# Patient Record
Sex: Female | Born: 1965 | Race: White | Hispanic: No | Marital: Married | Smoking: Never smoker
Health system: Southern US, Community
[De-identification: ages and names within clinical notes are randomized; demographics above are authoritative.]

## PROBLEM LIST (undated history)

## (undated) DIAGNOSIS — N951 Menopausal and female climacteric states: Secondary | ICD-10-CM

## (undated) DIAGNOSIS — N946 Dysmenorrhea, unspecified: Secondary | ICD-10-CM

## (undated) DIAGNOSIS — G4700 Insomnia, unspecified: Secondary | ICD-10-CM

## (undated) HISTORY — DX: Menopausal and female climacteric states: N95.1

## (undated) HISTORY — DX: Insomnia, unspecified: G47.00

## (undated) HISTORY — DX: Dysmenorrhea, unspecified: N94.6

---

## 1997-06-17 ENCOUNTER — Inpatient Hospital Stay (HOSPITAL_COMMUNITY): Admission: AD | Admit: 1997-06-17 | Discharge: 1997-06-20 | Payer: Self-pay | Admitting: Obstetrics and Gynecology

## 1997-07-16 ENCOUNTER — Other Ambulatory Visit: Admission: RE | Admit: 1997-07-16 | Discharge: 1997-07-16 | Payer: Self-pay | Admitting: Obstetrics and Gynecology

## 1998-08-09 ENCOUNTER — Other Ambulatory Visit: Admission: RE | Admit: 1998-08-09 | Discharge: 1998-08-09 | Payer: Self-pay | Admitting: Obstetrics and Gynecology

## 1998-10-18 ENCOUNTER — Inpatient Hospital Stay (HOSPITAL_COMMUNITY): Admission: AD | Admit: 1998-10-18 | Discharge: 1998-10-18 | Payer: Self-pay | Admitting: *Deleted

## 1999-12-19 ENCOUNTER — Other Ambulatory Visit: Admission: RE | Admit: 1999-12-19 | Discharge: 1999-12-19 | Payer: Self-pay | Admitting: Obstetrics and Gynecology

## 2001-01-07 ENCOUNTER — Other Ambulatory Visit: Admission: RE | Admit: 2001-01-07 | Discharge: 2001-01-07 | Payer: Self-pay | Admitting: Obstetrics and Gynecology

## 2001-11-25 ENCOUNTER — Ambulatory Visit (HOSPITAL_COMMUNITY): Admission: RE | Admit: 2001-11-25 | Discharge: 2001-11-25 | Payer: Self-pay | Admitting: Obstetrics and Gynecology

## 2001-11-25 ENCOUNTER — Encounter: Payer: Self-pay | Admitting: Obstetrics and Gynecology

## 2002-02-17 ENCOUNTER — Other Ambulatory Visit: Admission: RE | Admit: 2002-02-17 | Discharge: 2002-02-17 | Payer: Self-pay | Admitting: Obstetrics and Gynecology

## 2003-03-20 ENCOUNTER — Other Ambulatory Visit: Admission: RE | Admit: 2003-03-20 | Discharge: 2003-03-20 | Payer: Self-pay | Admitting: Obstetrics and Gynecology

## 2004-08-15 ENCOUNTER — Other Ambulatory Visit: Admission: RE | Admit: 2004-08-15 | Discharge: 2004-08-15 | Payer: Self-pay | Admitting: Obstetrics and Gynecology

## 2005-02-01 ENCOUNTER — Ambulatory Visit: Payer: Self-pay | Admitting: Family Medicine

## 2005-11-23 ENCOUNTER — Ambulatory Visit: Payer: Self-pay | Admitting: Family Medicine

## 2005-11-26 ENCOUNTER — Emergency Department (HOSPITAL_COMMUNITY): Admission: EM | Admit: 2005-11-26 | Discharge: 2005-11-26 | Payer: Self-pay | Admitting: Emergency Medicine

## 2006-04-03 HISTORY — PX: OTHER SURGICAL HISTORY: SHX169

## 2006-06-01 ENCOUNTER — Ambulatory Visit: Payer: Self-pay | Admitting: Family Medicine

## 2006-08-31 ENCOUNTER — Encounter (INDEPENDENT_AMBULATORY_CARE_PROVIDER_SITE_OTHER): Payer: Self-pay | Admitting: Obstetrics and Gynecology

## 2006-08-31 ENCOUNTER — Ambulatory Visit (HOSPITAL_COMMUNITY): Admission: RE | Admit: 2006-08-31 | Discharge: 2006-08-31 | Payer: Self-pay | Admitting: Obstetrics and Gynecology

## 2007-02-03 IMAGING — CR DG CHEST 2V
2 series · 2 of 2 positions shown · non-contrast
Comparison: None

CLINICAL DATA: Fever, cough

CHEST - 2 VIEW:

[w chest pa]
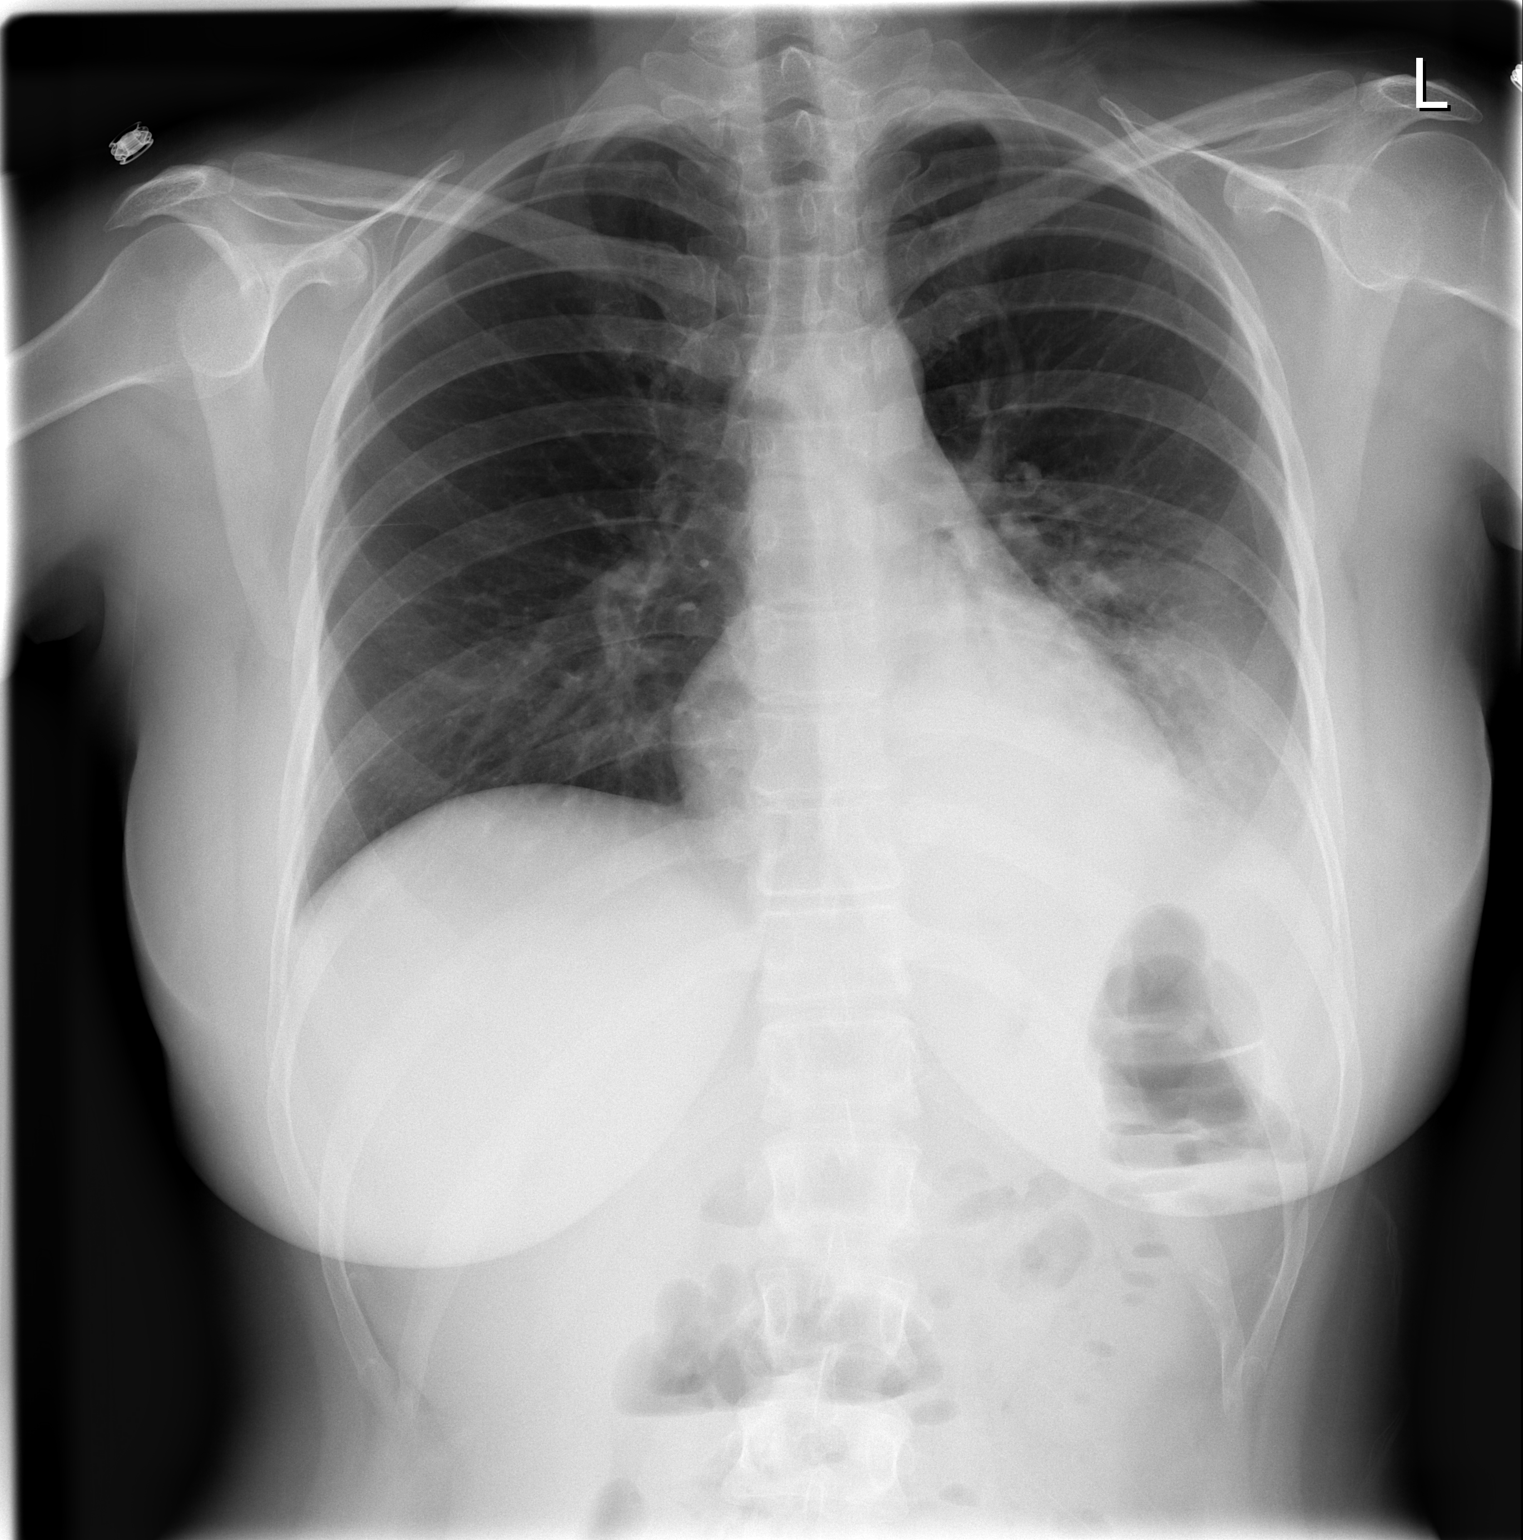

[w chest lat]
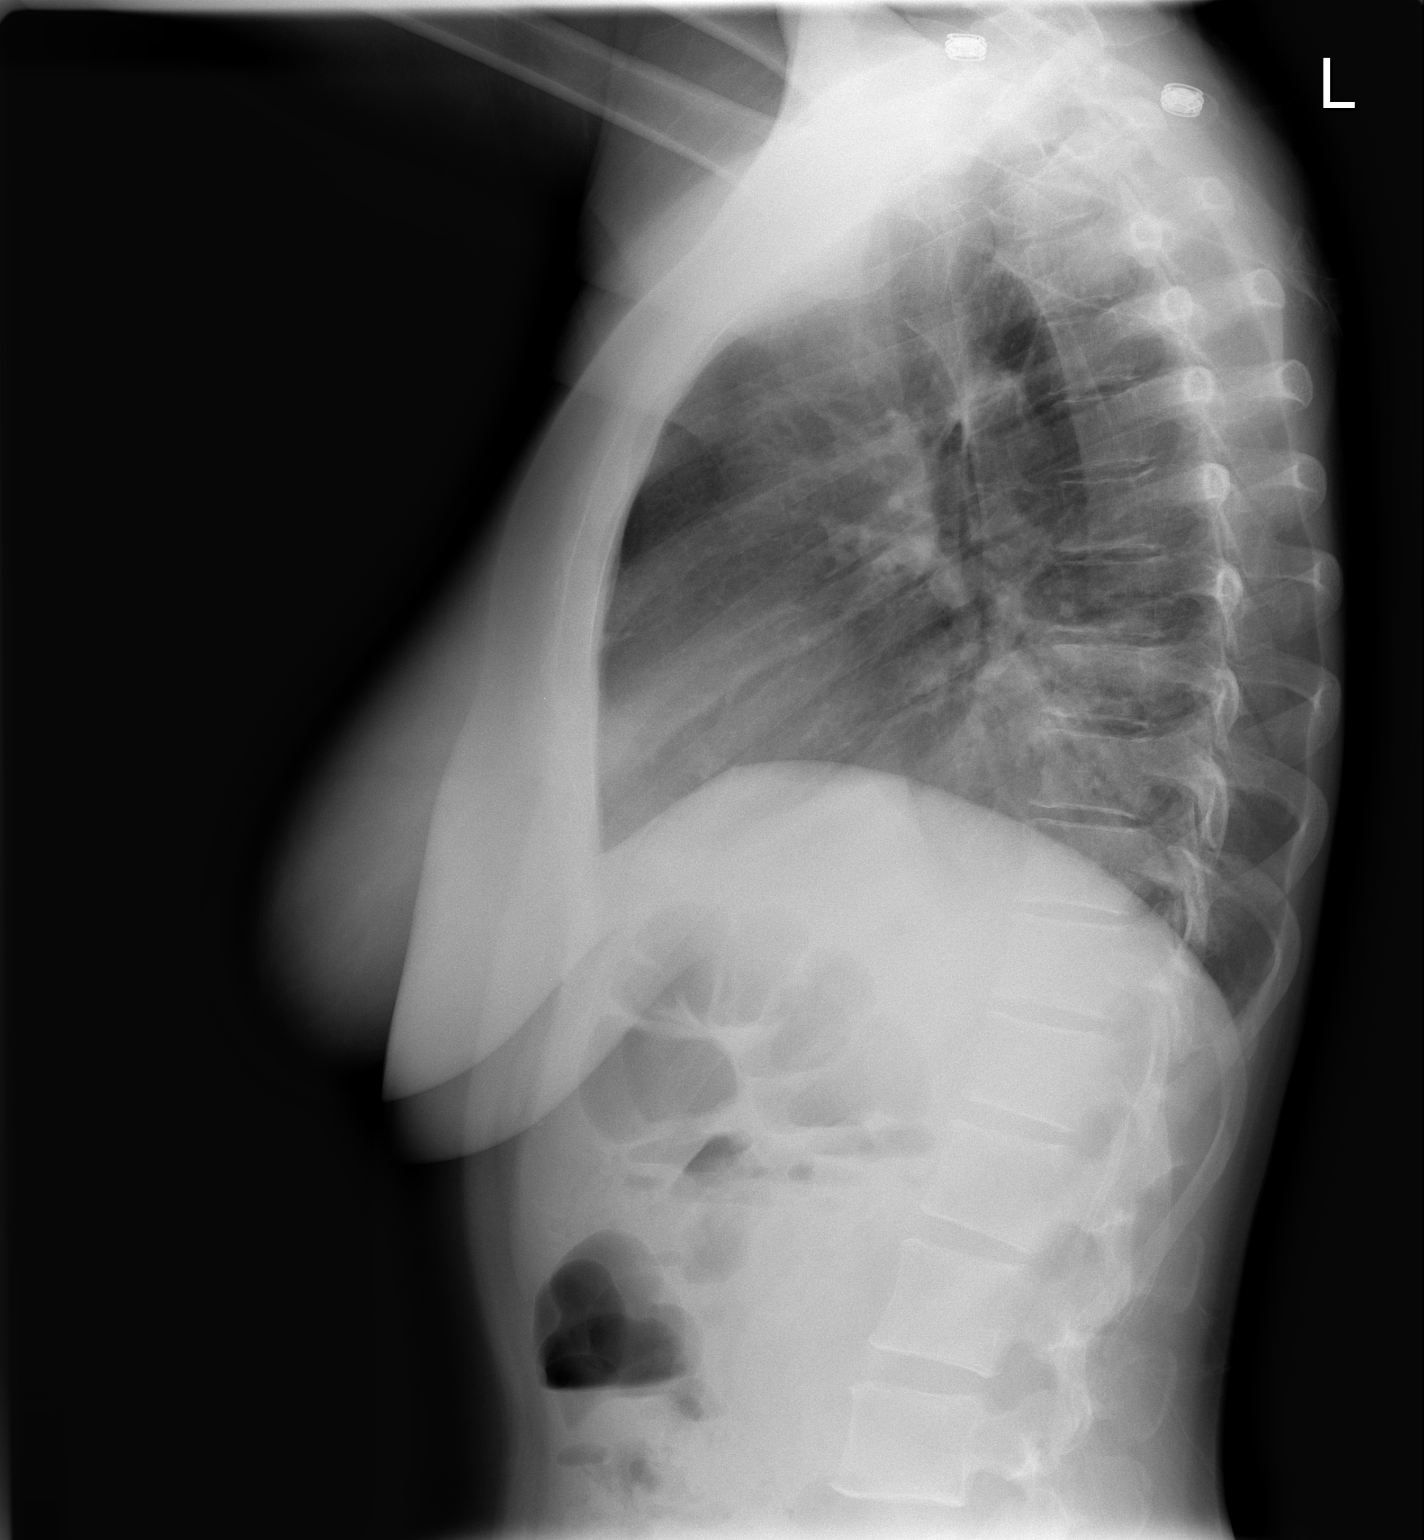

[2 of 2 positions shown; findings below may reference images not displayed]

FINDINGS: There is consolidation in the left lower lobe compatible with
pneumonia. There may be a small left effusion. Right lung is clear. Heart is
normal size.
IMPRESSION: Left lower lobe pneumonia.

## 2009-09-06 DIAGNOSIS — R87610 Atypical squamous cells of undetermined significance on cytologic smear of cervix (ASC-US): Secondary | ICD-10-CM | POA: Insufficient documentation

## 2009-09-10 ENCOUNTER — Encounter: Admission: RE | Admit: 2009-09-10 | Discharge: 2009-09-10 | Payer: Self-pay | Admitting: Obstetrics and Gynecology

## 2010-08-19 NOTE — Assessment & Plan Note (Signed)
[facility]                                   ON-CALL NOTE   RAYCHELLE, HUDMAN                    MRN:          [id number]  DATE:11/25/2005                            DOB:          1966-01-21    Time 2:20 p.m.  Phone number is [phone].  Dr. Tawanna Cooler and Dr. Clent Ridges are her  primary.   CHIEF COMPLAINT:  Cough.   The patient states she has a very bad bronchitis.  She has been on  Augmentin, and her fever got better and then came back to 102 this  afternoon.  She is coughing a lot, and wants to know what to take for cough.  She is taking Tylenol and ibuprofen as needed.  Her son has some Xopenex,  and she does not know if that would help her.  She denies wheezing or  tightness.  I told her that Xopenex generally helps reactive airways more so  than cough, and that it may not be helpful to her.  Of note, she told me her  son has recently had pneumonia, which worries me as well.  I told her if her  condition does not improve through the day, that she really needs to go to  the Emergency Room for a chest x-ray and further evaluation.  She will try  some Mucinex DM for cough control in the meantime, and if she does not  improve later in the day will go to the ER.                                   Marne A. Tower, MD   MAT/MedQ  DD:  11/25/2005  DT:  11/26/2005  Job #:  [id number]   cc:   Jeannett Senior A. Clent Ridges, MD

## 2010-08-19 NOTE — Op Note (Signed)
NAME:  Sheila Fitzgerald, Sheila Fitzgerald             ACCOUNT NO.:  [id number]   MEDICAL RECORD NO.:  [id number]          PATIENT TYPE:  AMB   LOCATION:  SDC                           FACILITY:  [facility]   PHYSICIAN:  Michelle L. Grewal, M.D.DATE OF BIRTH:  June 27, 1965   DATE OF PROCEDURE:  08/31/2006  DATE OF DISCHARGE:                               OPERATIVE REPORT   PREOPERATIVE DIAGNOSIS:  Dysfunctional uterine bleeding and pelvic pain.   POSTOPERATIVE DIAGNOSIS:  Dysfunctional uterine bleeding and pelvic  pain.   PROCEDURE:  Diagnostic laparoscopy, dilatation and curettage, and lysis  of adhesions.   SURGEON:  Dr. Vincente Poli.   ANESTHESIA:  General.   SPECIMENS:  Uterine curettings.   ESTIMATED BLOOD LOSS:  Minimal.   COMPLICATIONS:  None.   PROCEDURE:  Patient was taken to the operating room.  She was intubated.  She was prepped and draped in the usual sterile fashion.  An in-and-out  catheter was used to empty the bladder.  A uterine manipulator was  inserted.  Attention was turned to the abdomen where an infraumbilical  incision was made,a nd the Veress needle was inserted.  Pneumoperitoneum  was performed.  An 11-mm trocar was inserted under direct visualization.  The laparoscope was introduced through the trocar sheath.  The 5-mm  suprapubic port was placed under direct visualization.  I then inspected  the entire pelvis and abdomen.  Upper abdomen appeared normal.  Lower  abdomen:  The uterus was slightly enlarged.  I saw no definite myoma.  It was questionable that she may have and adenomyosis.  Ovaries appeared  unremarkable.  The fallopian tubes appeared normal.  She had a small  adhesion involving the sigmoid to the left mesosalpinx which was taken  down carefully with scissors.  No endometriosis was noted.  The  pneumoperitoneum was released.  The instruments were removed from the  abdominal cavity.  The incisions were closed with 2-0 Vicryl and then  closed with Dermabond skin  adhesive.  At this point, we completed the  laparoscopy.  I then went down to the vagina, inserted a speculum into  the vagina, grasped the cervix with a tenaculum and dilated the cervical  os gently.  I then inserted a sharp curette and did a very thorough  sharp curettage x3 with retrieval of moderate amount of tissue.  All  tissue was sent to pathology for analysis.  All instruments removed from  the vagina.  The patient tolerated the procedure extremely well.  All  sponge, lap and instrument counts were correct x2.  The patient went to  recovery room in stable condition.     Michelle L. Vincente Poli, M.D.  Electronically Signed    MLG/MEDQ  D:  08/31/2006  T:  08/31/2006  Job:  [id number]

## 2011-01-19 ENCOUNTER — Other Ambulatory Visit: Payer: Self-pay | Admitting: Dermatology

## 2012-02-20 ENCOUNTER — Other Ambulatory Visit: Payer: Self-pay | Admitting: Obstetrics and Gynecology

## 2012-05-01 ENCOUNTER — Encounter: Payer: Self-pay | Admitting: Family Medicine

## 2012-05-01 ENCOUNTER — Ambulatory Visit (INDEPENDENT_AMBULATORY_CARE_PROVIDER_SITE_OTHER): Payer: 59 | Admitting: Family Medicine

## 2012-05-01 VITALS — BP 138/82 | HR 134 | Temp 98.4°F | Ht 62.25 in | Wt 130.0 lb

## 2012-05-01 DIAGNOSIS — J329 Chronic sinusitis, unspecified: Secondary | ICD-10-CM

## 2012-05-01 DIAGNOSIS — F32A Depression, unspecified: Secondary | ICD-10-CM | POA: Insufficient documentation

## 2012-05-01 DIAGNOSIS — F411 Generalized anxiety disorder: Secondary | ICD-10-CM

## 2012-05-01 DIAGNOSIS — N946 Dysmenorrhea, unspecified: Secondary | ICD-10-CM

## 2012-05-01 DIAGNOSIS — F419 Anxiety disorder, unspecified: Secondary | ICD-10-CM

## 2012-05-01 DIAGNOSIS — Z7189 Other specified counseling: Secondary | ICD-10-CM

## 2012-05-01 DIAGNOSIS — M25559 Pain in unspecified hip: Secondary | ICD-10-CM | POA: Insufficient documentation

## 2012-05-01 DIAGNOSIS — Z7689 Persons encountering health services in other specified circumstances: Secondary | ICD-10-CM

## 2012-05-01 DIAGNOSIS — F329 Major depressive disorder, single episode, unspecified: Secondary | ICD-10-CM

## 2012-05-01 DIAGNOSIS — G4700 Insomnia, unspecified: Secondary | ICD-10-CM | POA: Insufficient documentation

## 2012-05-01 LAB — CBC WITH DIFFERENTIAL/PLATELET
Basophils Absolute: 0 10*3/uL (ref 0.0–0.1)
Basophils Relative: 0.3 % (ref 0.0–3.0)
Eosinophils Relative: 0.7 % (ref 0.0–5.0)
Hemoglobin: 13.4 g/dL (ref 12.0–15.0)
Lymphocytes Relative: 27.7 % (ref 12.0–46.0)
Monocytes Relative: 7.8 % (ref 3.0–12.0)
Neutro Abs: 4.9 10*3/uL (ref 1.4–7.7)
RBC: 4.52 Mil/uL (ref 3.87–5.11)
RDW: 12.9 % (ref 11.5–14.6)
WBC: 7.7 10*3/uL (ref 4.5–10.5)

## 2012-05-01 LAB — BASIC METABOLIC PANEL
GFR: 73.8 mL/min (ref >60.00)
Potassium: 5.2 mEq/L — ABNORMAL HIGH (ref 3.5–5.1)
Sodium: 141 mEq/L (ref 135–145)

## 2012-05-01 LAB — LIPID PANEL
LDL Cholesterol: 128 mg/dL — ABNORMAL HIGH (ref 0–99)
Total CHOL/HDL Ratio: 4

## 2012-05-01 LAB — HEMOGLOBIN A1C: Hgb A1c MFr Bld: 5.7 % (ref 4.6–6.5)

## 2012-05-01 LAB — TSH: TSH: 0.69 u[IU]/mL (ref 0.35–5.50)

## 2012-05-01 MED ORDER — ESCITALOPRAM OXALATE 10 MG PO TABS: 10.0000 mg | ORAL_TABLET | Freq: Every day | ORAL | Status: DC

## 2012-05-01 MED ORDER — AMOXICILLIN 875 MG PO TABS: 875.0000 mg | ORAL_TABLET | Freq: Two times a day (BID) | ORAL | Status: DC

## 2012-05-01 NOTE — Progress Notes (Signed)
Chief Complaint  Patient presents with  . Establish Care  . impacted ear  . panic attacks    HPI:  Sheila Fitzgerald is here to establish care. [facility] Vincente [facility]   Has the following chronic problems and concerns today:  Patient Active Problem List  Diagnosis  . Dysmenorrhea, ? perimenopausal symptoms - followed by Dr. Vincente Poli in gyn  . Hip pain - followed by [facility]  . Insomnia  . Anxiety  . 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 and immediately she felt like she was having a panic attack, hand tingling, chest tightness, burning in chest, neck tension, claustrophobia, R ear felt full, tingling in face on and off mostly on R - she started worrying about a heart attack or stroke -also teenage son is stressing her out -went to urgent care last week and told anxiety and was given ativan - didn't really help -has had ears fill up before -has taken lexapro in the past, also takes Restoril for sleep - started two months ago  Fullness in ear and sinus and pressure: -for last few weeks -sinus congestion, pressure on R side of the face, ears full more on the R -drainage -no fevers, chills, SOB, toothpain  Health Maintenance: -she is not sure about vaccines and will get records for [place]  ROS: See pertinent positives and negatives per HPI.  Past Medical History  Diagnosis Date  . Insomnia   . Perimenopausal     treated by gyn  . Dysmenorrhea     Family History  Problem Relation Age of Onset  . Hypertension Mother   . Hyperlipidemia Mother   . Diabetes  Mother   . Stroke Father 66  . Hypertension Father   . Hyperlipidemia Father     History   Social History  . Marital Status: Married    Spouse Name: N/A    Number of Children: N/A  . Years of Education: N/A   Social History Main Topics  . Smoking status: None  . Smokeless tobacco: None  . Alcohol Use: Yes     Comment: 2-3 glasses of wine per week  . Drug Use: No  . Sexually Active: Yes   Other Topics Concern  . 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 , Disp: , Rfl: ;  Multiple Vitamin (MULTIVITAMIN) tablet, Take 1 tablet by mouth daily., Disp: , Rfl: ;  temazepam (RESTORIL) 30 MG capsule, Take 30 mg by mouth at bedtime as needed. , Disp: , Rfl: ;  amoxicillin (AMOXIL) 875 MG tablet, Take 1 tablet (875 mg total) by mouth 2 (two) times daily., Disp: 20 tablet, Rfl: 0 escitalopram (LEXAPRO) 10 MG tablet, Take 1 tablet (10 mg total) by mouth daily., Disp: 30 tablet, Rfl: 1  EXAM:  Filed Vitals:   05/01/12 1117  BP: 138/82  Pulse: 134  Temp: 98.4 F (36.9 C)    Body mass index is 23.59 kg/(m^2).  GENERAL: vitals reviewed and listed above, alert, oriented, appears well hydrated and in no acute distress  HEENT: atraumatic, conjunttiva clear, no obvious abnormalities on inspection of external nose and ears, normal appearance of ear canals except soft wax in R ear canal, clear effusion L, white nasal congestion R>L, mild post oropharyngeal erythema with PND and cobble stoning, no tonsillar edema or exudate, no sinus TTP  NECK: no obvious masses on inspection  LUNGS: clear to auscultation bilaterally, no wheezes, rales or rhonchi, good air movement  CV: HRRR, no peripheral edema  MS: moves all  extremities without noticeable abnormality  NEURO: normal movements of all extremities, CN II-XII grossly intact, PERRLA  PSYCH: pleasant and cooperative, anxious and tearful  ASSESSMENT AND PLAN:  Discussed the following assessment and plan:  1. Anxiety  Basic metabolic panel, TSH, CBC with Differential, escitalopram (LEXAPRO) 10 MG tablet  2. Dysmenorrhea, ? perimenopausal symptoms - followed by Dr. Vincente Poli in gyn    3. Hip pain - [facility] by [facility]    4. Insomnia    5. Sinusitis  amoxicillin (AMOXIL) 875 MG tablet  6. Encounter to establish care  Lipid Panel, Hemoglobin A1c, Basic metabolic panel, TSH, CBC with Differential  7. Depression    -NON-FASTING LABS TODAY -We reviewed the PMH, PSH, FH, SH, Meds and Allergies. -patient with obvious depression and anxiety - situational: discussed tx options and decided on the following:   -counseling, she will schedule  -start lexapro 10mg , risks discussed  -sleep hygeine  -prayer and regular exercise  -will continue Restoril (risks discussed, not to use with alcohol) for one month then  per her concerns with side effects will gradually wean off of this medication over the  next 2 months -for sinusitis: amoxicillin -for cerumen impaction - removed cerumen and pt felt much better, ear canal and TM normal after removal -for tingling: likely anxiety given in face and hands, normal neuro exam, offered referral to neuro given her extreme anxiety and worry for intercranial pathology (no evidence for this on exam), pt deferred at this time and will call if changes her mind -follow up in 1 month   -Patient advised to return or notify a doctor immediately if symptoms worsen or persist or new concerns arise.  Patient Instructions  -We have ordered labs or studies at this visit. It can take up to 1-2 weeks for results and processing. We will contact you with instructions IF your results are abnormal. Normal results will be released to  your [facility]. If you have not heard from [place] or can not find your results in [facility] in 2 weeks please contact our office.  -PLEASE SIGN UP FOR MYCHART TODAY   We recommend the following healthy lifestyle measures: - eat a healthy diet consisting of lots of vegetables, fruits, beans, nuts, seeds, healthy meats such as white chicken and fish and whole grains.  - avoid fried foods, fast food, processed foods, sodas, red meet and other fattening foods.  - get a least 150 minutes of aerobic exercise per week.   Take the amoxicillin for you sinusitis and start the lexapro  Schedule counseling  Please bring vaccine record to the next visit  Follow up in: 1 month      Satoya Feeley R.

## 2012-05-01 NOTE — Patient Instructions (Addendum)
-  We have ordered labs or studies at this visit. It can take up to 1-2 weeks for results and processing. We will contact you with instructions IF your results are abnormal. Normal results will be released to your [facility]. If you have not heard from [place] or can not find your results in [facility] in 2 weeks please contact our office.  -PLEASE SIGN UP FOR MYCHART TODAY   We recommend the following healthy lifestyle measures: - eat a healthy diet consisting of lots of vegetables, fruits, beans, nuts, seeds, healthy meats such as white chicken and fish and whole grains.  - avoid fried foods, fast food, processed foods, sodas, red meet and other fattening foods.  - get a least 150 minutes of aerobic exercise per week.   Take the amoxicillin for you sinusitis and start the lexapro  Schedule counseling  Please bring vaccine record to the next visit  Follow up in: 1 month

## 2012-05-02 ENCOUNTER — Telehealth: Payer: Self-pay | Admitting: Family Medicine

## 2012-05-02 NOTE — Telephone Encounter (Signed)
Called and spoke with pt and pt is aware. Pt states she had gotten this through Mychart.

## 2012-05-02 NOTE — Telephone Encounter (Signed)
Labs look pretty good. Mildly elevated cholesterol which is expected given you had eaten, very mildly elevated potassium - not likely to cause any symptoms. Thyroid and other labs look good. Can discuss at follow up and will release to [facility].

## 2012-05-18 ENCOUNTER — Other Ambulatory Visit: Payer: Self-pay

## 2012-05-21 ENCOUNTER — Ambulatory Visit: Payer: Self-pay | Admitting: Family Medicine

## 2012-06-24 ENCOUNTER — Other Ambulatory Visit: Payer: Self-pay | Admitting: Family Medicine

## 2012-06-24 NOTE — Telephone Encounter (Signed)
Called and spoke with pt and pt is aware of appt on 08/05/12 at 8:45 am.

## 2012-06-24 NOTE — Telephone Encounter (Signed)
Needs appointment in next 1-2 months.

## 2012-08-01 ENCOUNTER — Other Ambulatory Visit: Payer: Self-pay | Admitting: Family Medicine

## 2012-08-01 NOTE — Telephone Encounter (Signed)
Ok to ill for 1 month, but need follow up appointment.

## 2012-08-01 NOTE — Telephone Encounter (Signed)
Called and left a message for pt to return call to schedule appt.

## 2012-08-05 ENCOUNTER — Encounter: Payer: Self-pay | Admitting: Family Medicine

## 2012-08-05 ENCOUNTER — Ambulatory Visit (INDEPENDENT_AMBULATORY_CARE_PROVIDER_SITE_OTHER): Payer: 59 | Admitting: Family Medicine

## 2012-08-05 VITALS — BP 128/80 | Temp 98.3°F | Wt 134.0 lb

## 2012-08-05 DIAGNOSIS — F419 Anxiety disorder, unspecified: Secondary | ICD-10-CM

## 2012-08-05 DIAGNOSIS — F329 Major depressive disorder, single episode, unspecified: Secondary | ICD-10-CM

## 2012-08-05 DIAGNOSIS — F411 Generalized anxiety disorder: Secondary | ICD-10-CM

## 2012-08-05 MED ORDER — ESCITALOPRAM OXALATE 10 MG PO TABS: 10.0000 mg | ORAL_TABLET | Freq: Every day | ORAL | Status: DC

## 2012-08-05 NOTE — Progress Notes (Signed)
Chief Complaint  Patient presents with  . Follow-up    HPI:  Follow up:  Anxiety/Depression: -situational, see prior note -started lexapro last visit - she is doing much better -she was to see counselor, wean off Restoril - she quit the Restoril cold [place] and feels much better -eating well and running -denies SI    ROS: See pertinent positives and negatives per HPI.  Past Medical History  Diagnosis Date  . Insomnia   . Perimenopausal     treated by gyn  . Dysmenorrhea     Family History  Problem Relation Age of Onset  . Hypertension Mother   . Hyperlipidemia Mother   . Diabetes Mother   . Stroke Father 80  . Hypertension Father   . Hyperlipidemia Father     History   Social History  . Marital Status: Married    Spouse Name: N/A    Number of Children: N/A  . Years of Education: N/A   Social History Main Topics  . Smoking status: Never Smoker   . Smokeless tobacco: Not on file  . Alcohol Use: Yes     Comment: 2-3 glasses of wine per week  . Drug Use: No  . Sexually Active: Yes   Other Topics Concern  . 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 , Disp: , Rfl: ;  Multiple Vitamin (MULTIVITAMIN) tablet, Take 1 tablet by mouth daily., Disp: , Rfl:   EXAM:  Filed Vitals:   08/05/12 0855  BP: 128/80  Temp: 98.3 F (36.8 C)    Body mass index is 24.32 kg/(m^2).  GENERAL: vitals reviewed and listed above, alert, oriented, appears  well hydrated and in no acute distress  HEENT: atraumatic, conjunttiva clear, no obvious abnormalities on inspection of external nose and ears  NECK: no obvious masses on inspection  LUNGS: clear to auscultation bilaterally, no wheezes, rales or rhonchi, good air movement  CV: HRRR, no peripheral edema  MS: moves all extremities without noticeable abnormality  PSYCH: pleasant and cooperative, no obvious depression or anxiety  ASSESSMENT AND PLAN:  Discussed the following assessment and plan:  Anxiety - Plan: escitalopram (LEXAPRO) 10 MG tablet  Depression - Plan: escitalopram (LEXAPRO) 10 MG tablet  -she is doing great -continue lexapro for now, she will consider counseling -follow up in 6 months -Patient advised to return or notify a doctor immediately if symptoms worsen or persist or new concerns arise.  There are no Patient Instructions on file for this visit.   Kriste Basque R.

## 2012-08-14 ENCOUNTER — Telehealth: Payer: Self-pay | Admitting: Family Medicine

## 2012-08-14 NOTE — Telephone Encounter (Signed)
Pls advise.  

## 2012-08-14 NOTE — Telephone Encounter (Signed)
Pt is calling because she wants to stop taking Lexapro.  Pt states she has been taking the medication for 3-4 months and she states she was just seen in the office on 08/05/12 and told MD that she was going to continue it.  But she states she is feeling good and is concerned because she has had a little bit of a weight gain from it.  Pt wants to stop taking it but is unsure if she needs to wean herself off of it. Pt denied any symptoms to triage.  OFFICE WILL YOU PLEASE FOLLOW UP WITH PT IF THERE IS ANY SUGGESTIONS TO WEANING OFF THE MEDICATION

## 2012-08-15 MED ORDER — ESCITALOPRAM OXALATE 5 MG PO TABS: ORAL_TABLET | ORAL | Status: DC

## 2012-08-15 NOTE — Addendum Note (Signed)
Addended by: Azucena Freed on: 08/15/2012 02:46 PM   Modules accepted: Orders

## 2012-08-15 NOTE — Telephone Encounter (Signed)
Called and spoke with pt and pt is aware of tapering instructions. Rx for 5 mg sent to pharmacy per patient request.

## 2012-08-15 NOTE — Telephone Encounter (Signed)
Does not tend to cause weight gain. But if she feels great can try taper off. 1/2 (5mg ) tab daily for 2 weeks, then 1/2 tab every other day for 2 weeks then stop - or rx 5mg  dablets, # 21 if she would prefer (she is on 10mg  now.) If any return of depression or symptoms with stopping she should notify us. Thanks.

## 2012-09-09 ENCOUNTER — Encounter: Payer: Self-pay | Admitting: Family Medicine

## 2012-09-09 ENCOUNTER — Ambulatory Visit (INDEPENDENT_AMBULATORY_CARE_PROVIDER_SITE_OTHER): Payer: 59 | Admitting: Family Medicine

## 2012-09-09 VITALS — BP 120/78 | Temp 98.1°F | Wt 136.0 lb

## 2012-09-09 DIAGNOSIS — L259 Unspecified contact dermatitis, unspecified cause: Secondary | ICD-10-CM

## 2012-09-09 DIAGNOSIS — F411 Generalized anxiety disorder: Secondary | ICD-10-CM

## 2012-09-09 DIAGNOSIS — F419 Anxiety disorder, unspecified: Secondary | ICD-10-CM

## 2012-09-09 DIAGNOSIS — L309 Dermatitis, unspecified: Secondary | ICD-10-CM

## 2012-09-09 DIAGNOSIS — F329 Major depressive disorder, single episode, unspecified: Secondary | ICD-10-CM

## 2012-09-09 MED ORDER — ESCITALOPRAM OXALATE 10 MG PO TABS: 10.0000 mg | ORAL_TABLET | Freq: Every day | ORAL | Status: DC

## 2012-09-09 MED ORDER — PREDNISONE 10 MG PO TABS: ORAL_TABLET | ORAL | Status: DC

## 2012-09-09 NOTE — Progress Notes (Signed)
Chief Complaint  Patient presents with  . Rash    chest, face, bends of arms, hand, neck; red and raised over the weekend;     HPI:  1) Skin rash: -itchy rash arms and face -thinks could be poison ivy - has been working outside -no SOB, swelling of mouth, lesions around mouth or eyes, fevers, malaise -does use a lot of lotions and soaps, uses tide  2) Depression: -on zoloft recently, then was doing great and tried to wean off of this medication -but when weaned off started to get a little depressed again -now back on 10mg  for 2-3 weeks   ROS: See pertinent positives and negatives per HPI.  Past Medical History  Diagnosis Date  . Insomnia   . Perimenopausal     treated by gyn  . Dysmenorrhea     Family History  Problem Relation Age of Onset  . Hypertension Mother   . Hyperlipidemia Mother   . Diabetes Mother   . Stroke Father 2  . Hypertension Father   . Hyperlipidemia Father     History   Social History  . Marital Status: Married    Spouse Name: N/A    Number of Children: N/A  . Years of Education: N/A   Social History Main Topics  . Smoking status: Never Smoker   . Smokeless tobacco: None  . Alcohol Use: Yes     Comment: 2-3 glasses of wine per week  . Drug Use: No  . Sexually Active: Yes   Other Topics Concern  . 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 , Disp: , Rfl: ;  Multiple Vitamin (MULTIVITAMIN) tablet, Take 1 tablet by mouth daily., Disp: , Rfl:  predniSONE (DELTASONE) 10 MG tablet, 40mg  (4 tabs) daily for 4 days, then  (30mg ) 3 tabs daily for 4 days, then 20mg  (2 tabs) daily for 4 days, 10mg  (1 tab) daily for 4 days, Disp: 40 tablet, Rfl: 0  EXAM:  Filed Vitals:   09/09/12 0917  BP: 120/78  Temp: 98.1 F (36.7 C)    Body mass index is 24.68 kg/(m^2).  GENERAL: vitals reviewed and listed above, alert, oriented, appears well hydrated and in no acute distress  HEENT: atraumatic, conjunttiva clear, no obvious abnormalities on inspection of external nose and ears  NECK: no obvious masses on inspection  LUNGS: clear to auscultation bilaterally, no wheezes, rales or rhonchi, good air movement  CV: HRRR, no peripheral edema  SKIN: mapular erythematous rash in patchy distribution on arms, neck and face - no lesions around mouth or eyes  MS: moves all extremities without noticeable abnormality  PSYCH: pleasant and cooperative, no obvious depression or anxiety  ASSESSMENT AND PLAN:  Discussed the following assessment and plan:  Anxiety - Plan: escitalopram (LEXAPRO) 10 MG tablet  Depression - Plan: escitalopram (LEXAPRO) 10 MG tablet -refilled lexapro to continue for 6-12 months -follow up in 3 months or sooner if needed  Dermatitis - Plan: predniSONE (DELTASONE) 10  MG tablet -likely poison ivy, given distribution and lesions on face prednisone taper provided - risks and use discussed -follow up prn -Patient advised to return or notify a doctor immediately if symptoms worsen or persist or new concerns arise.  There are no Patient Instructions on file for this visit.   Kriste Basque R.

## 2012-09-30 ENCOUNTER — Ambulatory Visit (INDEPENDENT_AMBULATORY_CARE_PROVIDER_SITE_OTHER): Payer: 59 | Admitting: Family Medicine

## 2012-09-30 ENCOUNTER — Telehealth: Payer: Self-pay | Admitting: Family Medicine

## 2012-09-30 ENCOUNTER — Encounter: Payer: Self-pay | Admitting: Family Medicine

## 2012-09-30 VITALS — BP 120/72 | Temp 98.3°F | Wt 129.0 lb

## 2012-09-30 DIAGNOSIS — L309 Dermatitis, unspecified: Secondary | ICD-10-CM

## 2012-09-30 DIAGNOSIS — L259 Unspecified contact dermatitis, unspecified cause: Secondary | ICD-10-CM

## 2012-09-30 MED ORDER — PREDNISONE 10 MG PO TABS: ORAL_TABLET | ORAL | Status: DC

## 2012-09-30 NOTE — Telephone Encounter (Signed)
Patient Information:  Caller Name: Westley Gambles  Phone: [phone]  Patient: Sheila Fitzgerald, Sheila Fitzgerald  Gender: Female  DOB: 02/25/1962  Age: 47 Years  PCP: Kriste Basque [facility])  Pregnant: No  Office Follow Up:  Does the office need to follow up with this patient?: No  Instructions For The Office: N/A  RN Note:  Takes oral contraception daily; then off pill after 3 months for menses. Skin now appears burned or dry and peeling.  Symptoms  Reason For Call & Symptoms: Red, peeling, itchy  skin on neck, elbow, wrists and hands.  Symptoms returned the day after completed Prednisone taper.  Initially, Prednisone was very helpful.  Reviewed Health History In EMR: Yes  Reviewed Medications In EMR: Yes  Reviewed Allergies In EMR: Yes  Reviewed Surgeries / Procedures: Yes  Date of Onset of Symptoms: 09/07/2012  Treatments Tried: Theraview lotion  Treatments Tried Worked: No OB / GYN:  LMP: Unknown  Guideline(s) Used:  Poison Ivy - [facility] or [facility]  Disposition Per Guideline:   Go to [employer] Now  Reason For Disposition Reached:   Increasing redness around poison ivy and larger than 2 inches (5 cm)  Advice Given:  Hydrocortisone Cream for Itching:   Apply 1% hydrocortisone cream 4 times a day to reduce itching. Use it for 5 days.  Keep the cream in the refrigerator (Reason: it feels better if applied cold).  Apply Cold to the Area:  Soak the involved area in cool water for 20 minutes or massage it with an ice cube as often as necessary to reduce itching and oozing.  Oral Antihistamine Medication for Itching:   Antihistamines may cause sleepiness. Do not drink, drive, or operate dangerous machinery while taking antihistamines.  An over-the-counter antihistamine that causes less sleepiness is loratadine (e.g., Alavert or Claritin).  Avoid Scratching:   Cut your fingernails short and try not to scratch so as to prevent a secondary infection from bacteria.  Contagiousness:  Poison ivy or  oak is not contagious to others.  Expected Course:  Usually lasts 2 weeks. Treatment reduces the severity of the symptoms, not how long they last.  Call Back If:  Rash lasts longer than 3 weeks  It looks infected  You become worse.  Patient Will Follow Care Advice:  YES  Appointment Scheduled:  09/30/2012 15:00:00 Appointment Scheduled Provider:  Kriste Basque [facility])

## 2012-09-30 NOTE — Telephone Encounter (Signed)
pls advise

## 2012-09-30 NOTE — Progress Notes (Signed)
Chief Complaint  Patient presents with  . Rash    HPI:  Rash: -treated with prednisone taper about 3 weeks ago for ? Poison ivy and rash resolved completely -as soon as the steroid taper was completed itchy rash started to recur -does use scented soap -no SOB, fevers, chills  ROS: See pertinent positives and negatives per HPI.  Past Medical History  Diagnosis Date  . Insomnia   . Perimenopausal     treated by gyn  . Dysmenorrhea     Family History  Problem Relation Age of Onset  . Hypertension Mother   . Hyperlipidemia Mother   . Diabetes Mother   . Stroke Father 2  . Hypertension Father   . Hyperlipidemia Father     History   Social History  . Marital Status: Married    Spouse Name: N/A    Number of Children: N/A  . Years of Education: N/A   Social History Main Topics  . Smoking status: Never Smoker   . Smokeless tobacco: None  . Alcohol Use: Yes     Comment: 2-3 glasses of wine per week  . Drug Use: No  . Sexually Active: Yes   Other Topics Concern  . 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 , Disp: , Rfl: ;  Multiple Vitamin (MULTIVITAMIN) tablet, Take 1 tablet by mouth daily., Disp: , Rfl:  predniSONE (DELTASONE) 10 MG tablet, 40mg  (4 tablets) daily for 3 days, then 30mg  (3 tabs) daily for 3 days, 20mg  (2 tabs) daily for 3 days, then 10mg (1 tab) daily for 3 days. 12+ 9 + 6+ 3, Disp: 30 tablet, Rfl: 0  EXAM:  Filed Vitals:   09/30/12 1503  BP: 120/72  Temp: 98.3 F (36.8 C)    Body mass index is 23.41 kg/(m^2).  GENERAL:  vitals reviewed and listed above, alert, oriented, appears well hydrated and in no acute distress  HEENT: atraumatic, conjunttiva clear, no obvious abnormalities on inspection of external nose and ears  NECK: no obvious masses on inspection  SKIN: papular erythematous rash in patchy distribution on arms, neck and face  MS: moves all extremities without noticeable abnormality  PSYCH: pleasant and cooperative, no obvious depression or anxiety  ASSESSMENT AND PLAN:  Discussed the following assessment and plan:  Dermatitis - Plan: predniSONE (DELTASONE) 10 MG tablet  -likely rebound poison ivy dermatitis, discussed options - she wants to do longer course of steroids, risks discussed. She will see her dermatologist if recurs or persists. -Patient advised to return or notify a doctor immediately if symptoms worsen or persist or new concerns arise.  There are no Patient Instructions on file for this visit.   Kriste Basque R.

## 2012-09-30 NOTE — Patient Instructions (Signed)
--  As we discussed, we have prescribed a new medication for you at this appointment. We discussed the common and serious potential adverse effects of this medication and you can review these and more with the pharmacist when you pick up your medication.  Please follow the instructions for use carefully and notify us immediately if you have any problems taking this medication.  -follow up with your dermatologist if worsens or does not resolve

## 2012-11-07 ENCOUNTER — Telehealth: Payer: Self-pay | Admitting: Family Medicine

## 2012-11-07 ENCOUNTER — Encounter: Payer: Self-pay | Admitting: Family Medicine

## 2012-11-07 ENCOUNTER — Ambulatory Visit (INDEPENDENT_AMBULATORY_CARE_PROVIDER_SITE_OTHER): Payer: 59 | Admitting: Family Medicine

## 2012-11-07 VITALS — BP 120/94 | Temp 99.1°F | Wt 142.0 lb

## 2012-11-07 DIAGNOSIS — L259 Unspecified contact dermatitis, unspecified cause: Secondary | ICD-10-CM

## 2012-11-07 MED ORDER — PREDNISONE 20 MG PO TABS: 40.0000 mg | ORAL_TABLET | Freq: Every day | ORAL | Status: DC

## 2012-11-07 NOTE — Progress Notes (Signed)
Chief Complaint  Patient presents with  . Rash on face    HPI:  Has had recurrent rash, initially thought to be toxicodendron or other contact dermatitis, previous tx twice with steroids. She had been told to use completely hypoallergenic regimen and was advised to see her dermatologist if recurred. She was doing great on hypoallergenic regimen for over a month, but then used skin ceutical Resveratrol B E night lotion on face and neck and then broke out with red irritated skin on neck and face with some puffiness and on hands. She is sure this is what has been causing the reaction as had used on and off. She has an appointment with her dermatologist later this month. Denies: swelling of lips or mouth, SOB, throat closing, fevers, chills, malaise  ROS: See pertinent positives and negatives per HPI.  Past Medical History  Diagnosis Date  . Insomnia   . Perimenopausal     treated by gyn  . Dysmenorrhea     Family History  Problem Relation Age of Onset  . Hypertension Mother   . Hyperlipidemia Mother   . Diabetes Mother   . Stroke Father 67  . Hypertension Father   . Hyperlipidemia Father     History   Social History  . Marital Status: Married    Spouse Name: N/A    Number of Children: N/A  . Years of Education: N/A   Social History Main Topics  . Smoking status: Never Smoker   . Smokeless tobacco: None  . Alcohol Use: Yes     Comment: 2-3 glasses of wine per week  . Drug Use: No  . Sexually Active: Yes   Other Topics Concern  . 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 , Disp: , Rfl: ;  Multiple Vitamin (MULTIVITAMIN) tablet, Take 1 tablet by mouth daily., Disp: , Rfl: ;  predniSONE (DELTASONE) 20 MG tablet, Take 2 tablets (40 mg total) by mouth daily., Disp: 10 tablet, Rfl: 0  EXAM:  Filed Vitals:   11/07/12 1005  BP: 120/94  Temp: 99.1 F (37.3 C)    Body mass index is 25.77 kg/(m^2).  GENERAL: vitals reviewed and listed above, alert, oriented, appears well hydrated and in no acute distress  HEENT: atraumatic, conjunttiva clear, no obvious abnormalities on inspection of external nose and ears  NECK: no obvious masses on inspection  LUNGS: clear to auscultation bilaterally, no wheezes, rales or rhonchi, good air movement  CV: HRRR, no peripheral edema  SKIN: irritated erythematous maculopapular rash on face and neck   MS: moves all extremities without noticeable abnormality  PSYCH: pleasant and cooperative, no obvious depression or anxiety  ASSESSMENT AND PLAN:  Discussed the following assessment and plan:  Contact dermatitis - Plan: predniSONE (DELTASONE) 20 MG tablet  -advised zyrtec and advised must see her dermatologist and stay away from any products not labled hypoallergenic -advised short course steroids, antihistamine (zyrtec) and follow up with derm -she is to go to  the ED if any throat swelling, lip or mouth swelling, SOB, or worsening -Patient advised to return or notify a doctor immediately if symptoms worsen or persist or new concerns arise.  There are no Patient Instructions on file for this visit.   Kriste Basque R.

## 2012-11-07 NOTE — Telephone Encounter (Signed)
Caller Name: Alaiza  Phone: [phone]  Patient: Sheila Fitzgerald, Sheila Fitzgerald  Gender: Female  DOB: 1965/06/15  Age: 47 Years  PCP: Kriste Basque [facility])   Does the office need to follow up with this patient?: No  RN Note:  rash on face (swollen) ,neck (peeling) ,hands, eyes are swollen, thinks from cosmetics. States was told to call dermatologist office, unable to get appt till end of August.   Reason For Call & Symptoms: emergent call regarding rash after using cosmetics, similar to previous symptoms, was treated with Predisone  Reviewed Health History In EMR: Yes  Reviewed Medications In EMR: Yes  Reviewed Allergies In EMR: Yes  Reviewed Surgeries / Procedures: Yes  Date of Onset of Symptoms: 11/05/2012  GYN:  LMP: continuous OCP  Guideline(s) Used:  Rash or Redness - Localized  Disposition Per Guideline:   See Today in Office  Reason For Disposition Reached:  Patient wants to be seen  Advice Given:  Call Back If:  You become worse.  Patient Will Follow Care Advice:  YES  Appointment Scheduled:  11/07/2012 10:00:00; Provider:  Kriste Basque [facility])

## 2013-02-06 ENCOUNTER — Other Ambulatory Visit: Payer: Self-pay

## 2013-05-27 ENCOUNTER — Other Ambulatory Visit: Payer: Self-pay | Admitting: Obstetrics and Gynecology

## 2014-06-01 ENCOUNTER — Other Ambulatory Visit: Payer: Self-pay | Admitting: Obstetrics and Gynecology

## 2014-06-02 LAB — CYTOLOGY - PAP

## 2016-12-21 ENCOUNTER — Encounter: Payer: Self-pay | Admitting: Family Medicine

## 2017-04-12 ENCOUNTER — Encounter: Payer: Self-pay | Admitting: Family Medicine

## 2017-06-07 ENCOUNTER — Ambulatory Visit: Payer: Self-pay | Admitting: *Deleted

## 2017-06-07 ENCOUNTER — Encounter: Payer: Self-pay | Admitting: Physician Assistant

## 2017-06-07 NOTE — Telephone Encounter (Signed)
Called in c/o having diarrhea for 4 weeks.  She has an appt for a consult with a GI doctor on 06/20/17.   Until then she is wanting to know if there was anything she could do to help with the diarrhea until then.   She is using Imodium which helps for 3-4 days but when she stops taking it the diarrhea returns.   "Every time I eat a little while later I'm having diarrhea{.    I dread eating though I have a good appetite and want to eat.   Not been on antibiotics recently or been sick.  I went over the care advise to help prevent dehydration.   Instructed her to call us back or go to the ED if her symptoms become worse or she develops dizziness, weakness and feeling very dry.  I made her appt with Dr. Colin Benton for Monday 06/11/17 at 9:45am.  She verbalized understanding and agreed to follow the care advise suggestions.   Reason for Disposition . Diarrhea is a chronic symptom (recurrent or ongoing AND present > 4 weeks)  Answer Assessment - Initial Assessment Questions 1. DIARRHEA SEVERITY: "How bad is the diarrhea?" "How many extra stools have you had in the past 24 hours than normal?"    - MILD: Few loose or mushy BMs; increase of 1-3 stools over normal daily number of stools; mild increase in ostomy output.   - MODERATE: Increase of 4-6 stools daily over normal; moderate increase in ostomy output.   - SEVERE (or Worst Possible): Increase of 7 or more stools daily over normal; moderate increase in ostomy output; incontinence.     It wakes me up during the night.   No blood.  I'm bloated and distended.  I'll get cramps at time.   No fever.    2. ONSET: "When did the diarrhea begin?"      4 weeks ago. 3. BM CONSISTENCY: "How loose or watery is the diarrhea?"      Very liquid.   Sometimes some formed pieces but not much.   I wake up at night sick.   I feel like I'm holding it back.   I can go about any time and have liquid stool.    I'm using Immodium which helps for 3-4 days.   Then the diarrhea  comes back.   4. VOMITING: "Are you also vomiting?" If so, ask: "How many times in the past 24 hours?"      No vomiting 5. ABDOMINAL PAIN: "Are you having any abdominal pain?" If yes: "What does it feel like?" (e.g., crampy, dull, intermittent, constant)      Bloated and distended.   Also feel very run down and depressed.   6. ABDOMINAL PAIN SEVERITY: If present, ask: "How bad is the pain?"  (e.g., Scale 1-10; mild, moderate, or severe)    - MILD (1-3): doesn't interfere with normal activities, abdomen soft and not tender to touch     - MODERATE (4-7): interferes with normal activities or awakens from sleep, tender to touch     - SEVERE (8-10): excruciating pain, doubled over, unable to do any normal activities       Cramps at time.  7. ORAL INTAKE: If vomiting, "Have you been able to drink liquids?" "How much fluids have you had in the past 24 hours?"     I'm drinking a lot of water. 8. HYDRATION: "Any signs of dehydration?" (e.g., dry mouth [not just dry lips], too weak to stand,  dizziness, new weight loss) "When did you last urinate?"     I'm working and going about my normal routine. 9. EXPOSURE: "Have you traveled to a foreign country recently?" "Have you been exposed to anyone with diarrhea?" "Could you have eaten any food that was spoiled?"     No 10. OTHER SYMPTOMS: "Do you have any other symptoms?" (e.g., fever, blood in stool)       No bloody or fever.    11. PREGNANCY: "Is there any chance you are pregnant?" "When was your last menstrual period?"       I take birth control pills for heavy periods every day.  Protocols used: [facility]

## 2017-06-11 ENCOUNTER — Encounter: Payer: Self-pay | Admitting: Family Medicine

## 2017-06-11 ENCOUNTER — Ambulatory Visit (INDEPENDENT_AMBULATORY_CARE_PROVIDER_SITE_OTHER): Payer: 59 | Admitting: Family Medicine

## 2017-06-11 VITALS — BP 122/84 | HR 76 | Temp 98.9°F | Ht 62.25 in | Wt 168.4 lb

## 2017-06-11 DIAGNOSIS — R197 Diarrhea, unspecified: Secondary | ICD-10-CM | POA: Diagnosis not present

## 2017-06-11 NOTE — Progress Notes (Signed)
HPI:  Sheila Fitzgerald is a very pleasant 52 year old, whom I have not seen in some time, here for an acute visit for intermittent diarrhea.  She reports that this started about 6 weeks ago with what she thinks was a stomach bug.  It then resolved, but recurs every 4-5 days.  She will have 4-5 days of normal bowel movements that are formed, then will have a day or 2 of loose to watery diarrhea several times a day.  These episodes are accompanied by mild abdominal discomfort and some gas.  If she takes Imodium, the symptoms resolved.  She is seeing the gastroenterologist about this on the 21st.  She also will be having a colonoscopy.  She denies fevers, unexplained weight loss, melena, hematochezia, abdominal pain or symptoms between these episodes, recent antibiotics or foreign travel.  She was doing a keto diet with "tons" of cauliflower before the symptoms started.  Reports she is eating a normal diet now.  She does wonder about gluten issues or infection.Today she feels well.  She has not had symptoms since last Thursday. A number of health maintenance measures are due.  However, she thinks she has had most of these with her gynecologist or elsewhere.  She reports her mammogram is scheduled.  She reports she had her Pap smear in 2017 and it was normal.   ROS: See pertinent positives and negatives per HPI.  Past Medical History:  Diagnosis Date  . Dysmenorrhea   . Insomnia   . Perimenopausal    treated by gyn    Past Surgical History:  Procedure Laterality Date  . CESAREAN SECTION  1999  . laproscopy  2008    Family History  Problem Relation Age of Onset  . Hypertension Mother   . Hyperlipidemia Mother   . Diabetes Mother   . Stroke Father 49  . Hypertension Father   . Hyperlipidemia Father     Current Outpatient Medications:  Marland Kitchen  MICROGESTIN FE 1/20 1-20 MG-MCG tablet, Take 1 tablet by mouth daily. , Disp: , Rfl:  .  Multiple Vitamin (MULTIVITAMIN) tablet, Take 1 tablet by  mouth daily., Disp: , Rfl:  .  Omega-3 Fatty Acids (FISH OIL PO), Take by mouth., Disp: , Rfl:  .  Probiotic Product (PROBIOTIC PO), Take by mouth., Disp: , Rfl:  .  Thiamine HCl (VITAMIN B-1 PO), Take by mouth., Disp: , Rfl:   EXAM:  Vitals:   06/11/17 0946  BP: 122/84  Pulse: 76  Temp: 98.9 F (37.2 C)    Body mass index is 30.55 kg/m.  GENERAL: vitals reviewed and listed above, alert, oriented, appears well hydrated and in no acute distress  HEENT: atraumatic, conjunttiva clear, no obvious abnormalities on inspection of external nose and ears  NECK: no obvious masses on inspection  LUNGS: clear to auscultation bilaterally, no wheezes, rales or rhonchi, good air movement  CV: HRRR, no peripheral edema  ABD: BS+, soft, nttp  MS: moves all extremities without noticeable abnormality  PSYCH: pleasant and cooperative, no obvious depression or anxiety  ASSESSMENT AND PLAN:  Discussed the following assessment and plan:  Diarrhea, unspecified type  -we discussed possible serious and likely etiologies, workup and treatment, treatment risks and return precautions -offered stool studies, labs while waiting on GI appt -also advised avoidance of dairy for a few weeks to see if this helps -after this discussion, [name] opted for trial of dairy diet, Imodium as needed and seeing GI as planned -she declined labs or studies  here today, as she reports she will plan to do these with her gastroenterologist if symptoms recur -follow up advised as needed Health maintenance measures were reviewed.  She believes most of them are done or scheduled.  She will check on her tetanus vaccine and let us know.  [id number]  Declined an AVS (pt instructions)  There are no Patient Instructions on file for this visit.  Lucretia Kern, DO

## 2017-06-21 ENCOUNTER — Ambulatory Visit (INDEPENDENT_AMBULATORY_CARE_PROVIDER_SITE_OTHER): Payer: 59 | Admitting: Physician Assistant

## 2017-06-21 ENCOUNTER — Encounter: Payer: Self-pay | Admitting: Physician Assistant

## 2017-06-21 ENCOUNTER — Other Ambulatory Visit (INDEPENDENT_AMBULATORY_CARE_PROVIDER_SITE_OTHER): Payer: 59

## 2017-06-21 VITALS — BP 140/80 | HR 94 | Ht 62.0 in | Wt 148.6 lb

## 2017-06-21 DIAGNOSIS — Z1211 Encounter for screening for malignant neoplasm of colon: Secondary | ICD-10-CM | POA: Diagnosis not present

## 2017-06-21 DIAGNOSIS — R194 Change in bowel habit: Secondary | ICD-10-CM | POA: Diagnosis not present

## 2017-06-21 LAB — CBC WITH DIFFERENTIAL/PLATELET
Basophils Absolute: 0 10*3/uL (ref 0.0–0.1)
Basophils Relative: 0.7 % (ref 0.0–3.0)
EOS PCT: 1.4 % (ref 0.0–5.0)
Eosinophils Absolute: 0.1 10*3/uL (ref 0.0–0.7)
HCT: 40.6 % (ref 36.0–46.0)
HEMOGLOBIN: 13.7 g/dL (ref 12.0–15.0)
Lymphocytes Relative: 38.9 % (ref 12.0–46.0)
Lymphs Abs: 2 10*3/uL (ref 0.7–4.0)
MCHC: 33.8 g/dL (ref 30.0–36.0)
MCV: 90.5 fl (ref 78.0–100.0)
MONOS PCT: 7.1 % (ref 3.0–12.0)
Monocytes Absolute: 0.4 10*3/uL (ref 0.1–1.0)
NEUTROS ABS: 2.7 10*3/uL (ref 1.4–7.7)
Neutrophils Relative %: 51.9 % (ref 43.0–77.0)
PLATELETS: 343 10*3/uL (ref 150.0–400.0)
RBC: 4.48 Mil/uL (ref 3.87–5.11)
RDW: 12.8 % (ref 11.5–15.5)
WBC: 5.1 10*3/uL (ref 4.0–10.5)

## 2017-06-21 LAB — COMPREHENSIVE METABOLIC PANEL
ALBUMIN: 4.6 g/dL (ref 3.5–5.2)
ALT: 22 U/L (ref 0–35)
AST: 22 U/L (ref 0–37)
Alkaline Phosphatase: 46 U/L (ref 39–117)
BUN: 17 mg/dL (ref 6–23)
CALCIUM: 9.7 mg/dL (ref 8.4–10.5)
CO2: 26 meq/L (ref 19–32)
CREATININE: 0.87 mg/dL (ref 0.40–1.20)
Chloride: 105 mEq/L (ref 96–112)
GFR: 73.16 mL/min (ref >60.00)
Glucose, Bld: 115 mg/dL — ABNORMAL HIGH (ref 70–99)
POTASSIUM: 4.2 meq/L (ref 3.5–5.1)
Sodium: 140 mEq/L (ref 135–145)
Total Bilirubin: 0.4 mg/dL (ref 0.2–1.2)
Total Protein: 7.4 g/dL (ref 6.0–8.3)

## 2017-06-21 LAB — IGA: IgA: 139 mg/dL (ref 68–378)

## 2017-06-21 NOTE — Patient Instructions (Signed)
You have been scheduled for a colonoscopy. Please follow written instructions given to you at your visit today.  Please pick up your prep supplies at the pharmacy within the next 1-3 days. If you use inhalers (even only as needed), please bring them with you on the day of your procedure. Your physician has requested that you go to www.startemmi.com and enter the access code given to you at your visit today. This web site gives a general overview about your procedure. However, you should still follow specific instructions given to you by our office regarding your preparation for the procedure.  Your physician has requested that you go to the basement for lab work before leaving today.  Please purchase the following medications over the counter and take as directed: Florastor 2 tablets daily for 2 months

## 2017-06-21 NOTE — Progress Notes (Addendum)
Chief Complaint: Change in bowel habits  HPI:    Sheila Fitzgerald is a 52 year old Caucasian female with a past medical history as listed below, who was referred to me by Lucretia Kern, DO for a complaint of change in bowel habits.      Today, patient describes that February 2 she developed a "stomach bug" with multiple loose stools per day accompanied by some abdominal pain and bloating.  This lasted for 10 days.  She then took Imodium 2 tabs once and had about a week of relief.  She will then develop her loose stools again and will take Imodium which will give her regular solid bowel movements for 4 days and then she will have diarrhea again.  This has been the pattern for the past 5 weeks.    Patient does tell me she was on the keto diet strictly in January and was eating a lot of cauliflower which was a big change from her regular diet and she wonders if this changed her bowel habits.  Also describes changing her regular probiotic to something different recently which she wonders if it made a difference.    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     Denies fever, chills, weight loss, anorexia, nausea, vomiting, heartburn, reflux, blood in her stool or symptoms that awaken her at night.  Past Medical History:  Diagnosis Date  . Dysmenorrhea   . Insomnia   . Perimenopausal    treated by gyn    Past Surgical History:  Procedure Laterality Date  . CESAREAN SECTION  1999  . laproscopy  2008    Current Outpatient Medications  Medication Sig Dispense Refill  . MICROGESTIN FE 1/20 1-20 MG-MCG tablet Take 1 tablet by mouth daily.     . Multiple Vitamin (MULTIVITAMIN) tablet Take 1 tablet by mouth daily.    . Omega-3 Fatty Acids (FISH OIL PO) Take by mouth.    . Probiotic Product (PROBIOTIC PO) Take by mouth.    . Thiamine HCl (VITAMIN B-1 PO) Take by mouth.     No current facility-administered medications for this visit.     Allergies as of 06/21/2017 - Review  Complete 06/11/2017  Allergen Reaction Noted  . Sulfa antibiotics  05/01/2012    Family History  Problem Relation Age of Onset  . Hypertension Mother   . Hyperlipidemia Mother   . Diabetes Mother   . Stroke Father 24  . Hypertension Father   . Hyperlipidemia Father     Social History   Socioeconomic History  . Marital status: Married    Spouse name: Not on file  . Number of children: Not on file  . Years of education: Not on file  . Highest education level: Not on file  Occupational History  . Not on file  Social Needs  . Financial resource strain: Not on file  . Food insecurity:    Worry: Not on file    Inability: Not on file  . Transportation needs:    Medical: Not on file    Non-medical: Not on file  Tobacco Use  . Smoking status: Never Smoker  . Smokeless tobacco: Never Used  Substance and Sexual Activity  . Alcohol use: Yes    Comment: 2-3 glasses of wine per week  . Drug use: No  . Sexual activity: Yes  Lifestyle  . Physical activity:    Days per week: Not on file    Minutes per session: Not on  file  . Stress: Not on file  Relationships  . Social connections:    Talks on phone: Not on file    Gets together: Not on file    Attends religious service: Not on file    Active member of club or organization: Not on file    Attends meetings of clubs or organizations: Not on file    Relationship status: Not on file  . Intimate partner violence:    Fear of current or ex partner: Not on file    Emotionally abused: Not on file    Physically abused: Not on file    Forced sexual activity: Not on file  Other Topics Concern  . Not on file  Social History Narrative   Work or School: Production designer, theatre/television/film, drug rep      Home Situation: lives with husband and teenage son      Spiritual Beliefs: christian       Lifestyle: runs about 20 miles per week, eats healthy             Review of Systems:    Constitutional: No weight loss, fever or chills Skin:  No rash  Cardiovascular: No chest pain Respiratory: No SOB Gastrointestinal: See HPI and otherwise negative Genitourinary: No dysuria  Neurological: No headache Musculoskeletal: No new muscle or joint pain Hematologic: No bleeding  Psychiatric: No history of depression or anxiety   Physical Exam:  Vital signs: BP 140/80   Pulse 94   Ht 5\' 2"  (1.575 m)   Wt 148 lb 9.6 oz (67.4 kg)   SpO2 98%   BMI 27.18 kg/m   Constitutional:   Pleasant Caucasian female appears to be in NAD, Well developed, Well nourished, alert and cooperative Head:  Normocephalic and atraumatic. Eyes:   PEERL, EOMI. No icterus. Conjunctiva pink. Ears:  Normal auditory acuity. Neck:  Supple Throat: Oral cavity and pharynx without inflammation, swelling or lesion.  Respiratory: Respirations even and unlabored. Lungs clear to auscultation bilaterally.   No wheezes, crackles, or rhonchi.  Cardiovascular: Normal S1, S2. No MRG. Regular rate and rhythm. No peripheral edema, cyanosis or pallor.  Gastrointestinal:  Soft, nondistended, nontender. No rebound or guarding. Normal bowel sounds. No appreciable masses or hepatomegaly. Rectal:  Not performed.  Msk:  Symmetrical without gross deformities. Without edema, no deformity or joint abnormality.  Neurologic:  Alert and  oriented x4;  grossly normal neurologically.  Skin:   Dry and intact without significant lesions or rashes. Psychiatric:  Demonstrates good judgement and reason without abnormal affect or behaviors.  No recent labs or imaging  Assessment: 1.  Change of bowel habits: to diarrhea 1-2 days out of the week, relieved by Imodium with results of soft solid stools for the next 4-5 days and then return to diarrhea, ever since "GI bug" in January, also changed her probiotic recently and diet; consider postinfectious IBS versus celiac versus other 2.  Screening for colorectal cancer: 52 years old and never had a previous screening  Plan: 1.  Ordered labs to  include CBC, CMP and celiac studies 2.  Recommend patient start Florastor 2 tabs daily for the next month 3.  Went ahead and scheduled patient for a screening colonoscopy in the [place] with Dr. Carlean Purl.  Did discuss risk, benefits, limitations and alternatives the patient agrees to proceed. 4.  Patient to follow in clinic per recommendations from Dr. Carlean Purl after time of procedure.  Ellouise Newer, PA-C [facility] 06/21/2017, 10:06 AM  Cc: Lucretia Kern, DO  Agree with Ms. Mort Sawyers evaluation and management.  Gatha Mayer, MD, [facility]

## 2017-06-22 LAB — TISSUE TRANSGLUTAMINASE, IGA: (TTG) AB, IGA: 1 U/mL

## 2017-06-25 ENCOUNTER — Telehealth: Payer: Self-pay | Admitting: Physician Assistant

## 2017-06-25 NOTE — Telephone Encounter (Signed)
Reviewed the plan based on the office note. Confirmed she is taking Florastor. She will use an Imodium during the week which gives her control for a few days at a time. She asks if it is worth her effort to look for food triggers. Encouraged her to do that. Discussed elimination diet. She thanks me for my call. Will call back if she develops any new symptoms or pain.

## 2017-08-17 ENCOUNTER — Encounter: Payer: 59 | Admitting: Internal Medicine

## 2017-09-20 ENCOUNTER — Encounter: Payer: 59 | Admitting: Internal Medicine

## 2017-11-23 ENCOUNTER — Encounter: Payer: 59 | Admitting: Internal Medicine

## 2020-03-18 ENCOUNTER — Encounter: Payer: 59 | Admitting: Gastroenterology

## 2020-04-16 ENCOUNTER — Other Ambulatory Visit: Payer: Self-pay

## 2020-04-16 ENCOUNTER — Ambulatory Visit (AMBULATORY_SURGERY_CENTER): Payer: Managed Care, Other (non HMO) | Admitting: *Deleted

## 2020-04-16 ENCOUNTER — Telehealth: Payer: Self-pay | Admitting: *Deleted

## 2020-04-16 VITALS — Ht 62.0 in | Wt 154.0 lb

## 2020-04-16 DIAGNOSIS — Z1211 Encounter for screening for malignant neoplasm of colon: Secondary | ICD-10-CM

## 2020-04-16 MED ORDER — PLENVU 140 G PO SOLR: 1.0000 | Freq: Once | ORAL | 0 refills | Status: AC

## 2020-04-16 NOTE — Telephone Encounter (Signed)
Sheila Fitzgerald,  This patient was seen by you in 06/2017 for change in bowel habit and screening colonoscopy. Dr.Gessner signed off on your OV note. She was scheduled to have a colonoscopy but cancelled. Now she is on the schedule for a colonoscopy with Dr.Danis. Is that ok to go ahead with the colonoscopy with Dr.Danis (instead of Carlean Purl) or does she need another OV?  Please advise.  Thank you, Physicist, medical (pre-visit)

## 2020-04-16 NOTE — Telephone Encounter (Signed)
I spoke with the patient. She denies any GI symptoms or concerns at this time. She is no longer having the symptoms like during her OV. She is taking Probiotics.

## 2020-04-16 NOTE — Progress Notes (Signed)
Patient's pre-visit was done today over the phone with the patient due to COVID-19 pandemic. Name,DOB and address verified. Insurance verified. Packet of Prep instructions mailed to patient including a copy of a consent form and pre-procedure patient acknowledgement form (with envelope to mail back to us)-pt is aware. Plenvu Coupon included. Patient understands to call us back with any questions or concerns. COVID-19 vaccines completed on 07/24/19 x2, per patient. Notified patient of our Covid-19 safety protocols.

## 2020-04-19 NOTE — Telephone Encounter (Signed)
I am not sure why she was scheduled with a different provider for colonoscopy. Usually though this does not matter, she can just continue to follow with Carlean Purl after time of procedure.  Thanks, JLL

## 2020-04-21 NOTE — Telephone Encounter (Signed)
agree

## 2020-04-21 NOTE — Telephone Encounter (Signed)
Of course she can keep her upcoming colonoscopy appointment with me. Thanks for the note.  - HD

## 2020-04-26 ENCOUNTER — Encounter: Payer: Self-pay | Admitting: Gastroenterology

## 2020-04-30 ENCOUNTER — Ambulatory Visit (AMBULATORY_SURGERY_CENTER): Payer: Managed Care, Other (non HMO) | Admitting: Gastroenterology

## 2020-04-30 ENCOUNTER — Other Ambulatory Visit: Payer: Self-pay

## 2020-04-30 ENCOUNTER — Encounter: Payer: Self-pay | Admitting: Gastroenterology

## 2020-04-30 VITALS — BP 136/91 | HR 71 | Temp 98.6°F | Resp 15 | Ht 62.0 in | Wt 154.0 lb

## 2020-04-30 DIAGNOSIS — Z1211 Encounter for screening for malignant neoplasm of colon: Secondary | ICD-10-CM

## 2020-04-30 DIAGNOSIS — D125 Benign neoplasm of sigmoid colon: Secondary | ICD-10-CM | POA: Diagnosis not present

## 2020-04-30 MED ORDER — SODIUM CHLORIDE 0.9 % IV SOLN
500.0000 mL | Freq: Once | INTRAVENOUS | Status: DC
Start: 2020-04-30 — End: 2020-04-30

## 2020-04-30 NOTE — Progress Notes (Signed)
Medical history reviewed with no changes since PV. VS assessed by C.W 

## 2020-04-30 NOTE — Progress Notes (Signed)
pt tolerated well. VSS. awake and to recovery. Report given to RN.  

## 2020-04-30 NOTE — Patient Instructions (Signed)
 Handout on polyps & diverticulosis  given to you today  Await pathology results on polyp removed     YOU HAD AN ENDOSCOPIC PROCEDURE TODAY AT THE [facility]:   Refer to the procedure report that was given to you for any specific questions about what was found during the examination.  If the procedure report does not answer your questions, please call your gastroenterologist to clarify.  If you requested that your care partner not be given the details of your procedure findings, then the procedure report has been included in a sealed envelope for you to review at your convenience later.  YOU SHOULD EXPECT: Some feelings of bloating in the abdomen. Passage of more gas than usual.  Walking can help get rid of the air that was put into your GI tract during the procedure and reduce the bloating. If you had a lower endoscopy (such as a colonoscopy or flexible sigmoidoscopy) you may notice spotting of blood in your stool or on the toilet paper. If you underwent a bowel prep for your procedure, you may not have a normal bowel movement for a few days.  Please Note:  You might notice some irritation and congestion in your nose or some drainage.  This is from the oxygen used during your procedure.  There is no need for concern and it should clear up in a day or so.  SYMPTOMS TO REPORT IMMEDIATELY:   Following lower endoscopy (colonoscopy or flexible sigmoidoscopy):  Excessive amounts of blood in the stool  Significant tenderness or worsening of abdominal pains  Swelling of the abdomen that is new, acute  Fever of 100F or higher    For urgent or emergent issues, a gastroenterologist can be reached at any hour by calling [phone]. Do not use MyChart messaging for urgent concerns.    DIET:  We do recommend a small meal at first, but then you may proceed to your regular diet.  Drink plenty of fluids but you should avoid alcoholic beverages for 24 hours.  ACTIVITY:  You should  plan to take it easy for the rest of today and you should NOT DRIVE or use heavy machinery until tomorrow (because of the sedation medicines used during the test).    FOLLOW UP: Our staff will call the number listed on your records 48-72 hours following your procedure to check on you and address any questions or concerns that you may have regarding the information given to you following your procedure. If we do not reach you, we will leave a message.  We will attempt to reach you two times.  During this call, we will ask if you have developed any symptoms of COVID 19. If you develop any symptoms (ie: fever, flu-like symptoms, shortness of breath, cough etc.) before then, please call [phone].  If you test positive for Covid 19 in the 2 weeks post procedure, please call and report this information to us.    If any biopsies were taken you will be contacted by phone or by letter within the next 1-3 weeks.  Please call us at [phone] if you have not heard about the biopsies in 3 weeks.    SIGNATURES/CONFIDENTIALITY: You and/or your care partner have signed paperwork which will be entered into your electronic medical record.  These signatures attest to the fact that that the information above on your After Visit Summary has been reviewed and is understood.  Full responsibility of the confidentiality of this discharge information lies   and/or your care-partner. 

## 2020-04-30 NOTE — Op Note (Signed)
[place] Patient Name: Sheila Fitzgerald Procedure Date: 04/30/2020 1:54 PM MRN: [id number] Endoscopist: Mallie Mussel L. Loletha Carrow , MD Age: 55 Referring MD:  Date of Birth: 16-Jan-1966 Gender: Female Account #: [id number] Procedure:                Colonoscopy Indications:              Screening for colorectal malignant neoplasm, This                            is the patient's first colonoscopy Medicines:                Monitored Anesthesia Care Procedure:                Pre-Anesthesia Assessment:                           - Prior to the procedure, a History and Physical                            was performed, and patient medications and                            allergies were reviewed. The patient's tolerance of                            previous anesthesia was also reviewed. The risks                            and benefits of the procedure and the sedation                            options and risks were discussed with the patient.                            All questions were answered, and informed consent                            was obtained. Prior Anticoagulants: The patient has                            taken no previous anticoagulant or antiplatelet                            agents. ASA Grade Assessment: II - A patient with                            mild systemic disease. After reviewing the risks                            and benefits, the patient was deemed in                            satisfactory condition to undergo the procedure.  After obtaining informed consent, the colonoscope                            was passed under direct vision. Throughout the                            procedure, the patient's blood pressure, pulse, and                            oxygen saturations were monitored continuously. The                            Olympus CF-HQ190L [id number]) Colonoscope was                            introduced through the anus  and advanced to the the                            cecum, identified by appendiceal orifice and                            ileocecal valve. The colonoscopy was performed                            without difficulty. The patient tolerated the                            procedure well. The quality of the bowel                            preparation was excellent. The ileocecal valve,                            appendiceal orifice, and rectum were photographed.                            The bowel preparation used was Plenvu. Scope In: 2:02:52 PM Scope Out: 2:17:06 PM Scope Withdrawal Time: 0 hours 9 minutes 43 seconds  Total Procedure Duration: 0 hours 14 minutes 14 seconds  Findings:                 The perianal and digital rectal examinations were                            normal.                           A single diverticulum was found in the proximal                            ascending colon.                           A diminutive polyp was found in the sigmoid colon.  The polyp was semi-sessile. The polyp was removed                            with a cold snare. Resection and retrieval were                            complete.                           The exam was otherwise without abnormality on                            direct and retroflexion views. Complications:            No immediate complications. Estimated Blood Loss:     Estimated blood loss was minimal. Impression:               - Diverticulosis in the proximal ascending colon.                           - One diminutive polyp in the sigmoid colon,                            removed with a cold snare. Resected and retrieved.                           - The examination was otherwise normal on direct                            and retroflexion views. Recommendation:           - Patient has a contact number available for                            emergencies. The signs and symptoms of potential                             delayed complications were discussed with the                            patient. Return to normal activities tomorrow.                            Written discharge instructions were provided to the                            patient.                           - Resume previous diet.                           - Continue present medications.                           - Await pathology results.                           -  Repeat colonoscopy is recommended for                            surveillance. The colonoscopy date will be                            determined after pathology results from today's                            exam become available for review. Elliannah Wayment L. Loletha Carrow, MD 04/30/2020 2:21:49 PM This report has been signed electronically.

## 2020-04-30 NOTE — Progress Notes (Signed)
Called to room to assist during endoscopic procedure.  Patient ID and intended procedure confirmed with present staff. Received instructions for my participation in the procedure from the performing physician.  

## 2020-05-04 ENCOUNTER — Telehealth: Payer: Self-pay | Admitting: *Deleted

## 2020-05-04 NOTE — Telephone Encounter (Signed)
  Follow up Call-  Call back number 04/30/2020  Post procedure Call Back phone  # [phone]  Permission to leave phone message Yes  Some recent data might be hidden     No answer at 2nd attempt follow up phone call.  Left message on voicemail.

## 2020-05-07 ENCOUNTER — Encounter: Payer: Self-pay | Admitting: Gastroenterology

## 2020-06-24 ENCOUNTER — Other Ambulatory Visit: Payer: Self-pay

## 2020-06-24 ENCOUNTER — Ambulatory Visit (INDEPENDENT_AMBULATORY_CARE_PROVIDER_SITE_OTHER): Payer: Managed Care, Other (non HMO) | Admitting: Family Medicine

## 2020-06-24 ENCOUNTER — Encounter: Payer: Self-pay | Admitting: Family Medicine

## 2020-06-24 VITALS — BP 170/90 | HR 86 | Temp 98.1°F | Ht 62.25 in | Wt 159.0 lb

## 2020-06-24 DIAGNOSIS — Z1159 Encounter for screening for other viral diseases: Secondary | ICD-10-CM

## 2020-06-24 DIAGNOSIS — Z0000 Encounter for general adult medical examination without abnormal findings: Secondary | ICD-10-CM

## 2020-06-24 DIAGNOSIS — I10 Essential (primary) hypertension: Secondary | ICD-10-CM | POA: Diagnosis not present

## 2020-06-24 DIAGNOSIS — R7309 Other abnormal glucose: Secondary | ICD-10-CM | POA: Diagnosis not present

## 2020-06-24 DIAGNOSIS — Z23 Encounter for immunization: Secondary | ICD-10-CM | POA: Diagnosis not present

## 2020-06-24 LAB — LIPID PANEL
Cholesterol: 244 mg/dL — ABNORMAL HIGH (ref 0–200)
HDL: 55.9 mg/dL (ref >39.00)
LDL Cholesterol: 164 mg/dL — ABNORMAL HIGH (ref 0–99)
NonHDL: 187.72
Total CHOL/HDL Ratio: 4
Triglycerides: 119 mg/dL (ref 0.0–149.0)
VLDL: 23.8 mg/dL (ref 0.0–40.0)

## 2020-06-24 LAB — CBC WITH DIFFERENTIAL/PLATELET
Basophils Absolute: 0 10*3/uL (ref 0.0–0.1)
Basophils Relative: 0.8 % (ref 0.0–3.0)
Eosinophils Absolute: 0.2 10*3/uL (ref 0.0–0.7)
Eosinophils Relative: 6 % — ABNORMAL HIGH (ref 0.0–5.0)
HCT: 41.2 % (ref 36.0–46.0)
Hemoglobin: 14.1 g/dL (ref 12.0–15.0)
Lymphocytes Relative: 43.7 % (ref 12.0–46.0)
Lymphs Abs: 1.7 10*3/uL (ref 0.7–4.0)
MCHC: 34.2 g/dL (ref 30.0–36.0)
MCV: 90.2 fl (ref 78.0–100.0)
Monocytes Absolute: 0.4 10*3/uL (ref 0.1–1.0)
Monocytes Relative: 9.7 % (ref 3.0–12.0)
Neutro Abs: 1.5 10*3/uL (ref 1.4–7.7)
Neutrophils Relative %: 39.8 % — ABNORMAL LOW (ref 43.0–77.0)
Platelets: 291 10*3/uL (ref 150.0–400.0)
RBC: 4.57 Mil/uL (ref 3.87–5.11)
RDW: 13 % (ref 11.5–15.5)
WBC: 3.9 10*3/uL — ABNORMAL LOW (ref 4.0–10.5)

## 2020-06-24 LAB — COMPREHENSIVE METABOLIC PANEL
ALT: 45 U/L — ABNORMAL HIGH (ref 0–35)
AST: 30 U/L (ref 0–37)
Albumin: 4.7 g/dL (ref 3.5–5.2)
Alkaline Phosphatase: 61 U/L (ref 39–117)
BUN: 15 mg/dL (ref 6–23)
CO2: 30 mEq/L (ref 19–32)
Calcium: 9.6 mg/dL (ref 8.4–10.5)
Chloride: 106 mEq/L (ref 96–112)
Creatinine, Ser: 0.81 mg/dL (ref 0.40–1.20)
GFR: 82.93 mL/min (ref >60.00)
Glucose, Bld: 108 mg/dL — ABNORMAL HIGH (ref 70–99)
Potassium: 4.7 mEq/L (ref 3.5–5.1)
Sodium: 142 mEq/L (ref 135–145)
Total Bilirubin: 0.6 mg/dL (ref 0.2–1.2)
Total Protein: 7.3 g/dL (ref 6.0–8.3)

## 2020-06-24 LAB — MICROALBUMIN / CREATININE URINE RATIO
Creatinine,U: 112.7 mg/dL
Microalb Creat Ratio: 0.8 mg/g (ref 0.0–30.0)
Microalb, Ur: 0.9 mg/dL (ref 0.0–1.9)

## 2020-06-24 LAB — TSH: TSH: 1.71 u[IU]/mL (ref 0.35–4.50)

## 2020-06-24 MED ORDER — HYDROCHLOROTHIAZIDE 25 MG PO TABS: 25.0000 mg | ORAL_TABLET | Freq: Every day | ORAL | 3 refills | Status: AC

## 2020-06-24 NOTE — Progress Notes (Signed)
Patient: Sheila Fitzgerald MRN: [id number] DOB: [birth year] PCP: Orma Flaming, MD     Subjective:  Chief Complaint  Patient presents with  . Annual Exam    HPI: The patient is a 55 y.o. female who presents today for annual exam/TOC. She denies any changes to past medical history. There have been no recent hospitalizations. They are following a well balanced diet and exercise plan. Weight has been stable. Concerns about high blood pressure. She has had many elevated readings over the past year and is starting to really worry about this. No family hx that she is aware of regarding HTN. She is asymptomatic.   No family hx of colon cancer and maternal grandmother with breast cancer.   Immunization History  Administered Date(s) Administered  . Influenza Inj Mdck Quad Pf 01/16/2017  . Influenza,inj,Quad PF,6+ Mos 01/21/2018  . Influenza-Unspecified 01/25/2014, 01/13/2016  . Tdap 06/24/2020  . Unspecified SARS-COV-2 Vaccination 06/26/2019, 07/24/2019   Colonoscopy: 04/30/2020 Mammogram: 01/12/2020 Pap smear: 01/12/2020   Review of Systems  Constitutional: Negative for chills, fatigue and fever.  HENT: Negative for dental problem, ear pain, hearing loss and trouble swallowing.   Eyes: Negative for visual disturbance.  Respiratory: Negative for cough, chest tightness and shortness of breath.   Cardiovascular: Negative for chest pain, palpitations and leg swelling.  Gastrointestinal: Negative for abdominal pain, blood in stool, diarrhea and nausea.  Endocrine: Negative for cold intolerance, polydipsia, polyphagia and polyuria.  Genitourinary: Negative for dysuria and hematuria.  Musculoskeletal: Negative for arthralgias.  Skin: Negative for rash.  Neurological: Negative for dizziness and headaches.  Psychiatric/Behavioral: Negative for dysphoric mood and sleep disturbance. The patient is not nervous/anxious.     Allergies Patient is allergic to sulfa antibiotics.  Past Medical  History Patient  has a past medical history of Dysmenorrhea, Insomnia, and Perimenopausal.  Surgical History Patient  has a past surgical history that includes Cesarean section (1999) and laproscopy (2008).  Family History Pateint's family history includes Diabetes in her mother; Hyperlipidemia in her father and mother; Hypertension in her father and mother; Stroke (age of onset: 20) in her father.  Social History Patient  reports that she has never smoked. She has never used smokeless tobacco. She reports current alcohol use of about 4.0 - 6.0 standard drinks of alcohol per week. She reports that she does not use drugs.    Objective: Vitals:   06/24/20 0850 06/24/20 0916  BP: (!) 160/100 (!) 170/90  Pulse: 86   Temp: 98.1 F (36.7 C)   TempSrc: Temporal   SpO2: 96%   Weight: 159 lb (72.1 kg)   Height: 5' 2.25" (1.581 m)     Body mass index is 28.85 kg/m.  Physical Exam Vitals reviewed.  Constitutional:      Appearance: Normal appearance. She is well-developed. She is obese.  HENT:     Head: Normocephalic and atraumatic.     Right Ear: Ear canal and external ear normal. There is impacted cerumen.     Left Ear: Tympanic membrane, ear canal and external ear normal.     Nose: Nose normal.     Mouth/Throat:     Mouth: Mucous membranes are moist.  Eyes:     Extraocular Movements: Extraocular movements intact.     Conjunctiva/sclera: Conjunctivae normal.     Pupils: Pupils are equal, round, and reactive to light.  Neck:     Thyroid: No thyromegaly.     Vascular: No carotid bruit.  Cardiovascular:     Rate and  Rhythm: Normal rate and regular rhythm.     Pulses: Normal pulses.     Heart sounds: Normal heart sounds. No murmur heard.   Pulmonary:     Effort: Pulmonary effort is normal.     Breath sounds: Normal breath sounds.  Abdominal:     General: Bowel sounds are normal. There is no distension.     Palpations: Abdomen is soft.     Tenderness: There is no abdominal  tenderness.  Musculoskeletal:     Cervical back: Normal range of motion and neck supple.  Lymphadenopathy:     Cervical: No cervical adenopathy.  Skin:    General: Skin is warm and dry.     Capillary Refill: Capillary refill takes less than 2 seconds.     Findings: No rash.  Neurological:     General: No focal deficit present.     Mental Status: She is alert and oriented to person, place, and time.     Cranial Nerves: No cranial nerve deficit.     Coordination: Coordination normal.     Deep Tendon Reflexes: Reflexes normal.  Psychiatric:        Mood and Affect: Mood normal.        Behavior: Behavior normal.        [place] Office Visit from 06/24/2020 in [place]  PHQ-2 Total Score 0     Ekg: NSR with rate of 66  Assessment/plan: 1. Annual physical exam Routine fasting labs today. HM reviewed and updated. She is doing well and exercising. Discussed BP today. See below. F/u in one year or as needed.  Patient counseling [x]    Nutrition: Stressed importance of moderation in sodium/caffeine intake, saturated fat and cholesterol, caloric balance, sufficient intake of fresh fruits, vegetables, fiber, calcium, iron, and 1 mg of folate supplement per day (for females capable of pregnancy).  [x]    Stressed the importance of regular exercise.   []    Substance Abuse: Discussed cessation/primary prevention of tobacco, alcohol, or other drug use; driving or other dangerous activities under the influence; availability of treatment for abuse.   [x]    Injury prevention: Discussed safety belts, safety helmets, smoke detector, smoking near bedding or upholstery.   [x]    Sexuality: Discussed sexually transmitted diseases, partner selection, use of condoms, avoidance of unintended pregnancy  and contraceptive alternatives.  [x]    Dental health: Discussed importance of regular tooth brushing, flossing, and dental visits.  [x]    Health maintenance and immunizations  reviewed. Please refer to Health maintenance section.    - CBC with Differential/Platelet - Comprehensive metabolic panel - Lipid panel - TSH  2. Benign essential HTN Above goal on numerous occasions and quite elevated today. She is asymptomatic. Starting her on hctz 25mg /day. Also asked that she get a arm BP cuff and start a log. Already exercising and eating low salt. She is working on weight loss. Will see her back in 2 weeks for BP check. May need second agent. Precautions given.  -baseline ekg done today as well.  - Microalbumin / creatinine urine ratio - EKG 12-Lead  3. Encounter for hepatitis C screening test for low risk patient  - Hepatitis C antibody  4. Need for Tdap vaccination  - Tdap vaccine greater than or equal to 7yo IM    This visit occurred during the SARS-CoV-2 public health emergency.  Safety protocols were in place, including screening questions prior to the visit, additional usage of staff PPE, and extensive cleaning of exam room  while observing appropriate contact time as indicated for disinfecting solutions.     Return in about 2 weeks (around 07/08/2020) for bp check.     Orma Flaming, MD [place]  06/24/2020

## 2020-06-24 NOTE — Patient Instructions (Signed)
-I want you to keep a log of blood pressure. Starting you on medication called hctz, it's a diurectic. Start with 1/2 tab x 3-4 days then start a full tab.  -try to get a blood pressure cuff and start a log, take once a day after medication -see you back in 2-4 weeks  So nice to meet you! Dr. Rogers Blocker   Hypertension, Adult Hypertension is another name for high blood pressure. High blood pressure forces your heart to work harder to pump blood. This can cause problems over time. There are two numbers in a blood pressure reading. There is a top number (systolic) over a bottom number (diastolic). It is best to have a blood pressure that is below 120/80. Healthy choices can help lower your blood pressure, or you may need medicine to help lower it. What are the causes? The cause of this condition is not known. Some conditions may be related to high blood pressure. What increases the risk?  Smoking.  Having type 2 diabetes mellitus, high cholesterol, or both.  Not getting enough exercise or physical activity.  Being overweight.  Having too much fat, sugar, calories, or salt (sodium) in your diet.  Drinking too much alcohol.  Having long-term (chronic) kidney disease.  Having a family history of high blood pressure.  Age. Risk increases with age.  Race. You may be at higher risk if you are African American.  Gender. Men are at higher risk than women before age 81. After age 70, women are at higher risk than men.  Having obstructive sleep apnea.  Stress. What are the signs or symptoms?  High blood pressure may not cause symptoms. Very high blood pressure (hypertensive crisis) may cause: ? Headache. ? Feelings of worry or nervousness (anxiety). ? Shortness of breath. ? Nosebleed. ? A feeling of being sick to your stomach (nausea). ? Throwing up (vomiting). ? Changes in how you see. ? Very bad chest pain. ? Seizures. How is this treated?  This condition is treated by making  healthy lifestyle changes, such as: ? Eating healthy foods. ? Exercising more. ? Drinking less alcohol.  Your health care provider may prescribe medicine if lifestyle changes are not enough to get your blood pressure under control, and if: ? Your top number is above 130. ? Your bottom number is above 80.  Your personal target blood pressure may vary. Follow these instructions at home: Eating and drinking  If told, follow the DASH eating plan. To follow this plan: ? Fill one half of your plate at each meal with fruits and vegetables. ? Fill one fourth of your plate at each meal with whole grains. Whole grains include whole-wheat pasta, brown rice, and whole-grain bread. ? Eat or drink low-fat dairy products, such as skim milk or low-fat yogurt. ? Fill one fourth of your plate at each meal with low-fat (lean) proteins. Low-fat proteins include fish, chicken without skin, eggs, beans, and tofu. ? Avoid fatty meat, cured and processed meat, or chicken with skin. ? Avoid pre-made or processed food.  Eat less than 1,500 mg of salt each day.  Do not drink alcohol if: ? Your doctor tells you not to drink. ? You are pregnant, may be pregnant, or are planning to become pregnant.  If you drink alcohol: ? Limit how much you use to:  0-1 drink a day for women.  0-2 drinks a day for men. ? Be aware of how much alcohol is in your drink. In the [place] one  drink equals one 12 oz bottle of beer (355 mL), one 5 oz glass of wine (148 mL), or one 1 oz glass of hard liquor (44 mL).   Lifestyle  Work with your doctor to stay at a healthy weight or to lose weight. Ask your doctor what the best weight is for you.  Get at least 30 minutes of exercise most days of the week. This may include walking, swimming, or biking.  Get at least 30 minutes of exercise that strengthens your muscles (resistance exercise) at least 3 days a week. This may include lifting weights or doing Pilates.  Do not use any  products that contain nicotine or tobacco, such as cigarettes, e-cigarettes, and chewing tobacco. If you need help quitting, ask your doctor.  Check your blood pressure at home as told by your doctor.  Keep all follow-up visits as told by your doctor. This is important.   Medicines  Take over-the-counter and prescription medicines only as told by your doctor. Follow directions carefully.  Do not skip doses of blood pressure medicine. The medicine does not work as well if you skip doses. Skipping doses also puts you at risk for problems.  Ask your doctor about side effects or reactions to medicines that you should watch for. Contact a doctor if you:  Think you are having a reaction to the medicine you are taking.  Have headaches that keep coming back (recurring).  Feel dizzy.  Have swelling in your ankles.  Have trouble with your vision. Get help right away if you:  Get a very bad headache.  Start to feel mixed up (confused).  Feel weak or numb.  Feel faint.  Have very bad pain in your: ? Chest. ? Belly (abdomen).  Throw up more than once.  Have trouble breathing. Summary  Hypertension is another name for high blood pressure.  High blood pressure forces your heart to work harder to pump blood.  For most people, a normal blood pressure is less than 120/80.  Making healthy choices can help lower blood pressure. If your blood pressure does not get lower with healthy choices, you may need to take medicine. This information is not intended to replace advice given to you by your health care provider. Make sure you discuss any questions you have with your health care provider. Document Revised: 11/28/2017 Document Reviewed: 11/28/2017 Elsevier Patient Education  2021 [employer] Preventive Care 1-53 Years Old, Female Preventive care refers to lifestyle choices and visits with your health care provider that can promote health and wellness. This includes:  A yearly  physical exam. This is also called an annual wellness visit.  Regular dental and eye exams.  Immunizations.  Screening for certain conditions.  Healthy lifestyle choices, such as: ? Eating a healthy diet. ? Getting regular exercise. ? Not using drugs or products that contain nicotine and tobacco. ? Limiting alcohol use. What can I expect for my preventive care visit? Physical exam Your health care provider will check your:  Height and weight. These may be used to calculate your BMI (body mass index). BMI is a measurement that tells if you are at a healthy weight.  Heart rate and blood pressure.  Body temperature.  Skin for abnormal spots. Counseling Your health care provider may ask you questions about your:  Past medical problems.  Family's medical history.  Alcohol, tobacco, and drug use.  Emotional well-being.  Home life and relationship well-being.  Sexual activity.  Diet, exercise, and sleep habits.  Work and work Statistician.  Access to firearms.  Method of birth control.  Menstrual cycle.  Pregnancy history. What immunizations do I need? Vaccines are usually given at various ages, according to a schedule. Your health care provider will recommend vaccines for you based on your age, medical history, and lifestyle or other factors, such as travel or where you work.   What tests do I need? Blood tests  Lipid and cholesterol levels. These may be checked every 5 years, or more often if you are over 42 years old.  Hepatitis C test.  Hepatitis B test. Screening  Lung cancer screening. You may have this screening every year starting at age 9 if you have a 30-pack-year history of smoking and currently smoke or have quit within the past 15 years.  Colorectal cancer screening. ? All adults should have this screening starting at age 57 and continuing until age 68. ? Your health care provider may recommend screening at age 61 if you are at increased  risk. ? You will have tests every 1-10 years, depending on your results and the type of screening test.  Diabetes screening. ? This is done by checking your blood sugar (glucose) after you have not eaten for a while (fasting). ? You may have this done every 1-3 years.  Mammogram. ? This may be done every 1-2 years. ? Talk with your health care provider about when you should start having regular mammograms. This may depend on whether you have a family history of breast cancer.  BRCA-related cancer screening. This may be done if you have a family history of breast, ovarian, tubal, or peritoneal cancers.  Pelvic exam and Pap test. ? This may be done every 3 years starting at age 61. ? Starting at age 29, this may be done every 5 years if you have a Pap test in combination with an HPV test. Other tests  STD (sexually transmitted disease) testing, if you are at risk.  Bone density scan. This is done to screen for osteoporosis. You may have this scan if you are at high risk for osteoporosis. Talk with your health care provider about your test results, treatment options, and if necessary, the need for more tests. Follow these instructions at home: Eating and drinking  Eat a diet that includes fresh fruits and vegetables, whole grains, lean protein, and low-fat dairy products.  Take vitamin and mineral supplements as recommended by your health care provider.  Do not drink alcohol if: ? Your health care provider tells you not to drink. ? You are pregnant, may be pregnant, or are planning to become pregnant.  If you drink alcohol: ? Limit how much you have to 0-1 drink a day. ? Be aware of how much alcohol is in your drink. In the [place] one drink equals one 12 oz bottle of beer (355 mL), one 5 oz glass of wine (148 mL), or one 1 oz glass of hard liquor (44 mL).   Lifestyle  Take daily care of your teeth and gums. Brush your teeth every morning and night with fluoride toothpaste. Floss  one time each day.  Stay active. Exercise for at least 30 minutes 5 or more days each week.  Do not use any products that contain nicotine or tobacco, such as cigarettes, e-cigarettes, and chewing tobacco. If you need help quitting, ask your health care provider.  Do not use drugs.  If you are sexually active, practice safe sex. Use a condom or other form of  protection to prevent STIs (sexually transmitted infections).  If you do not wish to become pregnant, use a form of birth control. If you plan to become pregnant, see your health care provider for a prepregnancy visit.  If told by your health care provider, take low-dose aspirin daily starting at age 10.  Find healthy ways to cope with stress, such as: ? Meditation, yoga, or listening to music. ? Journaling. ? Talking to a trusted person. ? Spending time with friends and family. Safety  Always wear your seat belt while driving or riding in a vehicle.  Do not drive: ? If you have been drinking alcohol. Do not ride with someone who has been drinking. ? When you are tired or distracted. ? While texting.  Wear a helmet and other protective equipment during sports activities.  If you have firearms in your house, make sure you follow all gun safety procedures. What's next?  Visit your health care provider once a year for an annual wellness visit.  Ask your health care provider how often you should have your eyes and teeth checked.  Stay up to date on all vaccines. This information is not intended to replace advice given to you by your health care provider. Make sure you discuss any questions you have with your health care provider. Document Revised: 12/23/2019 Document Reviewed: 11/29/2017 Elsevier Patient Education  2021 [employer].

## 2020-06-25 ENCOUNTER — Encounter: Payer: Self-pay | Admitting: Family Medicine

## 2020-06-25 ENCOUNTER — Other Ambulatory Visit: Payer: Self-pay | Admitting: Family Medicine

## 2020-06-25 DIAGNOSIS — R7309 Other abnormal glucose: Secondary | ICD-10-CM

## 2020-06-25 DIAGNOSIS — E782 Mixed hyperlipidemia: Secondary | ICD-10-CM | POA: Insufficient documentation

## 2020-06-25 LAB — HEPATITIS C ANTIBODY
Hepatitis C Ab: NONREACTIVE
SIGNAL TO CUT-OFF: 0.01 (ref <1.00)

## 2020-06-25 LAB — HEMOGLOBIN A1C: Hgb A1c MFr Bld: 5.4 % (ref 4.6–6.5)

## 2020-06-25 NOTE — Addendum Note (Signed)
Addended by: Brandy Hale on: 06/25/2020 03:34 PM   Modules accepted: Orders

## 2020-07-12 ENCOUNTER — Encounter: Payer: Self-pay | Admitting: Family Medicine

## 2020-07-13 ENCOUNTER — Other Ambulatory Visit: Payer: Managed Care, Other (non HMO)

## 2020-07-15 ENCOUNTER — Other Ambulatory Visit: Payer: Managed Care, Other (non HMO)

## 2020-07-20 ENCOUNTER — Other Ambulatory Visit: Payer: Managed Care, Other (non HMO)

## 2020-07-21 ENCOUNTER — Ambulatory Visit: Payer: Managed Care, Other (non HMO) | Admitting: Family Medicine

## 2020-07-21 ENCOUNTER — Encounter: Payer: Self-pay | Admitting: Family Medicine

## 2020-07-21 ENCOUNTER — Other Ambulatory Visit: Payer: Self-pay

## 2020-07-21 VITALS — BP 120/80 | HR 81 | Temp 98.6°F | Ht 62.25 in | Wt 156.2 lb

## 2020-07-21 DIAGNOSIS — I10 Essential (primary) hypertension: Secondary | ICD-10-CM

## 2020-07-21 LAB — BASIC METABOLIC PANEL
BUN: 22 mg/dL (ref 6–23)
CO2: 30 mEq/L (ref 19–32)
Calcium: 9.9 mg/dL (ref 8.4–10.5)
Chloride: 100 mEq/L (ref 96–112)
Creatinine, Ser: 0.81 mg/dL (ref 0.40–1.20)
GFR: 82.89 mL/min (ref >60.00)
Glucose, Bld: 103 mg/dL — ABNORMAL HIGH (ref 70–99)
Potassium: 4 mEq/L (ref 3.5–5.1)
Sodium: 139 mEq/L (ref 135–145)

## 2020-07-21 NOTE — Progress Notes (Signed)
Patient: Sheila Fitzgerald MRN: [id number] DOB: Mar 17, 1966 PCP: Orma Flaming, MD     Subjective:  Chief Complaint  Patient presents with  . Hypertension    2 week f/u    HPI: The patient is a 55 y.o. female who presents today for BP check. She says that her BP has improved with taking HCTZ-25. She recently had Covid, so she has not taken BP daily.   I saw her 2 weeks ago and her BP was was extremely elevated. We started her on hctz and she has been doing really well. Her home readings have been to goal as well 110-120/80-84. No issues with medication and no side effects. She fells much better. Doing well from covid. About 80% back and still doesn't have taste or smell back.   Review of Systems  Constitutional: Negative for chills, fatigue and fever.  HENT: Positive for congestion. Negative for dental problem, ear pain, hearing loss and trouble swallowing.   Eyes: Negative for visual disturbance.  Respiratory: Negative for cough, chest tightness and shortness of breath.   Cardiovascular: Negative for chest pain, palpitations and leg swelling.  Gastrointestinal: Negative for abdominal pain, blood in stool, diarrhea and nausea.  Endocrine: Negative for cold intolerance, polydipsia, polyphagia and polyuria.  Genitourinary: Negative for dysuria and hematuria.  Musculoskeletal: Negative for arthralgias.  Skin: Negative for rash.  Neurological: Negative for dizziness and headaches.  Psychiatric/Behavioral: Negative for dysphoric mood and sleep disturbance. The patient is not nervous/anxious.     Allergies Patient is allergic to sulfa antibiotics.  Past Medical History Patient  has a past medical history of Dysmenorrhea, Insomnia, and Perimenopausal.  Surgical History Patient  has a past surgical history that includes Cesarean section (1999) and laproscopy (2008).  Family History Pateint's family history includes Diabetes in her mother; Hyperlipidemia in her father and mother;  Hypertension in her father and mother; Stroke (age of onset: 64) in her father.  Social History Patient  reports that she has never smoked. She has never used smokeless tobacco. She reports current alcohol use of about 4.0 - 6.0 standard drinks of alcohol per week. She reports that she does not use drugs.    Objective: Vitals:   07/21/20 0931 07/21/20 1000  BP: 134/88 120/80  Pulse: 81   Temp: 98.6 F (37 C)   TempSrc: Temporal   SpO2: 99%   Weight: 156 lb 3.2 oz (70.9 kg)   Height: 5' 2.25" (1.581 m)     Body mass index is 28.34 kg/m.  Physical Exam Vitals reviewed.  Constitutional:      Appearance: Normal appearance. She is normal weight.  Cardiovascular:     Rate and Rhythm: Normal rate and regular rhythm.     Heart sounds: Normal heart sounds.  Pulmonary:     Effort: Pulmonary effort is normal.     Breath sounds: Normal breath sounds.  Abdominal:     General: Bowel sounds are normal.     Palpations: Abdomen is soft.  Skin:    General: Skin is warm.     Capillary Refill: Capillary refill takes less than 2 seconds.  Neurological:     General: No focal deficit present.     Mental Status: She is alert and oriented to person, place, and time.  Psychiatric:        Mood and Affect: Mood normal.        Behavior: Behavior normal.        Assessment/plan: 1. Benign essential HTN Blood pressure is perfect.  Medication is working great. She has plenty of refills and will check potassium today. She is good to f/u in one year or as needed. She is also working on exercise/diet with her cholesterol.  - Basic metabolic panel   Return in about 1 year (around 07/21/2021) for annual/hypertension .   Orma Flaming, MD [place]   07/21/2020

## 2021-06-14 ENCOUNTER — Other Ambulatory Visit: Payer: Self-pay | Admitting: Family Medicine

## 2023-10-25 ENCOUNTER — Other Ambulatory Visit: Payer: Self-pay | Admitting: Obstetrics and Gynecology

## 2023-10-25 DIAGNOSIS — R928 Other abnormal and inconclusive findings on diagnostic imaging of breast: Secondary | ICD-10-CM

## 2023-10-29 ENCOUNTER — Ambulatory Visit

## 2023-10-29 ENCOUNTER — Ambulatory Visit
Admission: RE | Admit: 2023-10-29 | Discharge: 2023-10-29 | Disposition: A | Discharge status: Home / Self Care | Source: Ambulatory Visit | Attending: Obstetrics and Gynecology | Admitting: Obstetrics and Gynecology

## 2023-10-29 DIAGNOSIS — R928 Other abnormal and inconclusive findings on diagnostic imaging of breast: Secondary | ICD-10-CM
